# Patient Record
Sex: Male | Born: 1950 | Race: White | Hispanic: No | Marital: Single | Smoking: Former smoker
Health system: Southern US, Community
[De-identification: ages and names within clinical notes are randomized; demographics above are authoritative.]

## PROBLEM LIST (undated history)

## (undated) DIAGNOSIS — I10 Essential (primary) hypertension: Secondary | ICD-10-CM

## (undated) DIAGNOSIS — E785 Hyperlipidemia, unspecified: Secondary | ICD-10-CM

## (undated) DIAGNOSIS — K469 Unspecified abdominal hernia without obstruction or gangrene: Secondary | ICD-10-CM

## (undated) DIAGNOSIS — Z9889 Other specified postprocedural states: Secondary | ICD-10-CM

## (undated) DIAGNOSIS — I509 Heart failure, unspecified: Secondary | ICD-10-CM

## (undated) DIAGNOSIS — Z8547 Personal history of malignant neoplasm of testis: Secondary | ICD-10-CM

## (undated) DIAGNOSIS — I2510 Atherosclerotic heart disease of native coronary artery without angina pectoris: Secondary | ICD-10-CM

## (undated) DIAGNOSIS — IMO0001 Reserved for inherently not codable concepts without codable children: Secondary | ICD-10-CM

## (undated) HISTORY — PX: HERNIA REPAIR: SHX51

---

## 2001-11-21 ENCOUNTER — Inpatient Hospital Stay (HOSPITAL_COMMUNITY): Admission: EM | Admit: 2001-11-21 | Discharge: 2001-11-24 | Payer: Self-pay | Admitting: *Deleted

## 2001-11-24 ENCOUNTER — Encounter: Payer: Self-pay | Admitting: Family Medicine

## 2001-12-18 ENCOUNTER — Encounter: Admission: RE | Admit: 2001-12-18 | Discharge: 2001-12-18 | Payer: Self-pay | Admitting: Family Medicine

## 2004-12-09 ENCOUNTER — Ambulatory Visit (HOSPITAL_COMMUNITY): Admission: RE | Admit: 2004-12-09 | Discharge: 2004-12-09 | Payer: Self-pay | Admitting: Gastroenterology

## 2004-12-09 ENCOUNTER — Encounter (INDEPENDENT_AMBULATORY_CARE_PROVIDER_SITE_OTHER): Payer: Self-pay | Admitting: *Deleted

## 2008-02-24 ENCOUNTER — Ambulatory Visit: Payer: Self-pay | Admitting: Surgery

## 2008-03-18 ENCOUNTER — Ambulatory Visit: Payer: Self-pay | Admitting: Surgery

## 2008-03-18 ENCOUNTER — Encounter: Payer: Self-pay | Admitting: Surgery

## 2008-03-18 ENCOUNTER — Inpatient Hospital Stay (HOSPITAL_COMMUNITY): Admission: RE | Admit: 2008-03-18 | Discharge: 2008-03-19 | Payer: Self-pay | Admitting: Surgery

## 2008-04-06 ENCOUNTER — Ambulatory Visit: Payer: Self-pay | Admitting: Surgery

## 2008-10-05 ENCOUNTER — Ambulatory Visit: Payer: Self-pay | Admitting: Surgery

## 2010-04-05 ENCOUNTER — Telehealth (INDEPENDENT_AMBULATORY_CARE_PROVIDER_SITE_OTHER): Payer: Self-pay | Admitting: *Deleted

## 2010-10-25 NOTE — Progress Notes (Signed)
  Request Recieved from [facility] sent to [facility]  April 05, 2010 8:57 AM

## 2011-02-07 NOTE — Procedures (Signed)
CAROTID DUPLEX EXAM   INDICATION:  Follow up carotid artery disease.   HISTORY:  Diabetes:  No.  Cardiac:  Yes.  Hypertension:  No.  Smoking:  Yes.  Previous Surgery:  Right CEA 03/18/2008 by Dr. Myra Gianotti.  CV History:  No.  Amaurosis Fugax No, Paresthesias No, Hemiparesis No.                                       RIGHT             LEFT  Brachial systolic pressure:         119               120  Brachial Doppler waveforms:         Triphasic         Triphasic  Vertebral direction of flow:        Antegrade         Antegrade  DUPLEX VELOCITIES (cm/sec)  CCA peak systolic                   116               110  ECA peak systolic                   158               518  ICA peak systolic                   128               168  ICA end diastolic                   51                52  PLAQUE MORPHOLOGY:                  N/A               Heterogenous  PLAQUE AMOUNT:                      N/A               Moderate  PLAQUE LOCATION:                    N/A               ICA/ECA   IMPRESSION:  1. Right velocities, mid internal carotid artery, near curvature, are      suggestive of a 40-59% stenosis; however, no significant plaque was      visualized.  2. Left internal carotid artery shows evidence of 40-59% stenosis.  3. Left external carotid artery stenosis.   ___________________________________________  V. Charlena Cross, MD   AS/MEDQ  D:  10/05/2008  T:  10/05/2008  Job:  [id number]

## 2011-02-07 NOTE — Discharge Summary (Signed)
Jordan Johnston, Jordan Johnston                ACCOUNT NO.:  [id number]   MEDICAL RECORD NO.:  [id number]          PATIENT TYPE:  INP   LOCATION:  3305                         FACILITY:  [facility]   PHYSICIAN:  Juleen China IV, MDDATE OF BIRTH:  1951-09-10   DATE OF ADMISSION:  03/18/2008  DATE OF DISCHARGE:  03/19/2008                               DISCHARGE SUMMARY   The patient of Dr. Myra Gianotti.   FINAL DISCHARGE DIAGNOSES:  1. Right carotid occlusive disease.  2. Coronary artery disease.  3. Testicular cancer.  4. Hypertension.  5. Hyperlipidemia.   PROCEDURES PERFORMED:  On March 19, 2008, the right carotid  endarterectomy with bovine pericardial patch angioplasty by Dr. Myra Gianotti.   COMPLICATIONS:  None.   DISCHARGE MEDICATIONS:  1. Lovaza 1 g 4 tablets daily.  2. Niaspan 1000 mg 2 tablets p.o. nightly.  3. Lisinopril 5 mg p.o. b.i.d.  4. Metoprolol 25 mg p.o. b.i.d.  5. Aspirin 325 mg p.o. daily.  6. Zocor 20 mg p.o. nightly.  7. Percocet 5/325 1 p.o. q.4 h. p.r.n.,  total #20 were given.   CONDITION ON DISCHARGE:  Stable, improving.   DISPOSITION:  He is being discharged home in stable and improving  condition.  He is instructed to clean his wound with soap and warm  water.  He is to increase his activity slowly.  He may walk up steps.  He should not drive for 3 weeks.  He is to observe his wound for  drainage, increasing swelling, redness, pain, fever greater than 101.2,  and neurological changes.  He is to see Dr. Myra Gianotti in 2 weeks in the  office and will arrange the appointment.   Brief identifying statement of complete details, please refer the typed  history and physical.  Briefly, this very pleasant 60 year old gentleman  was referred to Dr. Myra Gianotti for asymptomatic right carotid stenosis.  Dr. Myra Gianotti recommended carotid endarterectomy for stroke prevention.  He was informed of the risks and benefits of the procedure, and after  careful consideration, he elected to  proceed with surgery.   HOSPITAL COURSE:  Preoperative workup was completed as an outpatient.  He was brought in through same-day surgery and underwent the  aforementioned right carotid endarterectomy.  For complete details,  please refer the typed operative report.  The procedure was without  complications.  He was returned to the Postanesthesia Care Unit  extubated.  Following stabilization, he was transferred to a bed on a  surgical step-down unit.  He was observed overnight.  He was desirous of  discharge the following morning.  He was neurologically intact.  He was  felt stable and was discharged home.      Wilmon Arms, PA      V. Charlena Cross, MD  Electronically Signed    KEL/MEDQ  D:  03/19/2008  T:  03/19/2008  Job:  [id number]   cc:   Jorge Ny, MD

## 2011-02-07 NOTE — Assessment & Plan Note (Signed)
OFFICE VISIT   Jordan Johnston, Jordan Johnston  DOB:  1950-12-08                                       04/06/2008  ZOXWR#:[id number]   REASON FOR VISIT:  Followup carotid endarterectomy.   HISTORY:  This is a 59 year old gentleman who had an asymptomatic right  carotid stenosis.  He underwent a right carotid endarterectomy on  03/18/2008.  Postop course was uncomplicated.  Comes back today for  followup.  He has been doing well.  He has not been having headaches.  He did have some mild drainage from his incision.  He has not had  fevers.  He is not having neurologic deficits.   EXAM:  His blood pressure is 84/61 on the left and 106/66 in the right.  Pulse is 90.  General:  He is well appearing in no acute distress.  Cranial nerves 2-12 grossly intact.  His incision is well healed.  There  is a fullness over the incision.  He is neurologically intact.   ASSESSMENT/PLAN:  Status post right carotid endarterectomy.   PLAN:  I have given the patient permission mentioned to go back to work  today.  He does have a healing ridge over his incision, which is likely  just scar tissue.  I expect this to go down over time.  He is without  neurologic deficits.  The patient should continue his medication profile  today.  I will see him back in 6 months to see how he is doing.   Jorge Ny, MD  Electronically Signed   VWB/MEDQ  D:  04/06/2008  T:  04/07/2008  Job:  [id number]   cc:   Ritta Slot, MD

## 2011-02-07 NOTE — Assessment & Plan Note (Signed)
OFFICE VISIT   Jordan Johnston, Jordan Johnston  DOB:  September 15, 1951                                       02/24/2008  EAVWU#:[id number]   REASON FOR VISIT:  Right carotid stenosis.   REFERRING PHYSICIAN:  Dr. Silas Sacramento.   HISTORY:  This is a 60 year old white male, who I am seeing at the  request of Dr. Lynnea Ferrier for evaluation of right carotid stenosis.  The  patient is asymptomatic.  Specifically, he denies amaurosis fugax,  numbness, weakness in either extremity, slurred speech.  He does have a  significant coronary history.  He has a known 100% proximal circumflex  occlusion and a 30% proximal LAD lesion, as well as a 90% ostial  diagonal lesion.  He has an ejection fraction of 40%.  The patient also  has dyslipidemia, as well as hypertension.   PAST MEDICAL HISTORY:  Significant for coronary artery disease,  testicular cancer, hypertension and hyperlipidemia.   REVIEW OF SYSTEMS:  GENERAL:  No fever or chills.  No significant weight  loss.  CARDIAC:  No chest pain or shortness of breath.  PULMONARY:  Negative.  GI:  Negative.  GU:  Negative.  VASCULAR:  Negative.  NEURO:  Negative.  ORTHO:  Negative.  HEME:  Negative.  PSYCH:  Negative.  ENT:  Negative.   PAST SURGICAL HISTORY:  Testicular cancer removal and a midline hernia  repair x2.   FAMILY HISTORY:  No coronary artery disease.   SOCIAL HISTORY:  He is single.  He works at [employer] and in  Hol[employer].  He currently smokes approximately a pack a day.  He has a  40 pack year history.   MEDICATIONS:  Lovaza 1 gram per day.  Niaspan 1000 mg.  Lisinopril 10  mg.  Metoprolol 25 mg twice a day.  Bayer aspirin 325 daily.  Simvastatin 20 mg per day.  Lisinopril 5 mg twice a day.   ALLERGIES:  None.   PHYSICAL EXAMINATION:  Blood pressure is 109/65, heart rate is 70.  In  general, he is well-appearing in no acute distress.  HEENT:  Normocephalic, atraumatic.  Pupils are equal.  Sclerae are  anicteric.  Neck is supple.  There is no JVD.  There are bilateral carotid bruits.  Cardiovascular:  Regular rate and rhythm.  No murmurs, rubs or gallops.  Pulmonary:  Respirations and lungs are clear bilaterally.  Abdomen is  soft, nontender.  There is a midline scar.  There is no  hepatosplenomegaly.  Extremities are warm and well-perfused.  Psych, he  is alert and oriented x3.  Neuro:  Cranial nerves 2-12 grossly intact.  Skin is without rash.   DIAGNOSTIC STUDIES:  The patient comes with a duplex ultrasound, which  reveals 70-99% diameter reduction of the right carotid artery.   ASSESSMENT/PLAN:  This is a 60 year old with asymptomatic right carotid  stenosis.   PLAN:  I discussed the risks and benefits of proceeding with a carotid  endarterectomy at this time.  I quoted him a risk of stroke as well as  nerve injury, as well as the risk of bleeding and infection.  At this  point in time, the patient has agreed to proceed with carotid  endarterectomy.  He does need several weeks to organize being able to  take time away from work.  I have scheduled him to undergo  a right  carotid endarterectomy on Wednesday, June 24th.  I am going to re-image  his carotid today in the office to make sure that he has normal carotid  artery above the stenosis.   Jorge Ny, MD  Electronically Signed   VWB/MEDQ  D:  02/24/2008  T:  02/25/2008  Job:  [id number]   cc:   Ritta Slot, MD

## 2011-02-07 NOTE — Procedures (Signed)
CAROTID DUPLEX EXAM   INDICATION:  Followup, right carotid artery disease.   HISTORY:  Diabetes:  No.  Cardiac:  Yes.  Hypertension:  No.  Smoking:  Yes.  Previous Surgery:  CV History:  Amaurosis Fugax No, Paresthesias No, Hemiparesis No                                       RIGHT             LEFT  Brachial systolic pressure:  Brachial Doppler waveforms:  Vertebral direction of flow:        Antegrade  DUPLEX VELOCITIES (cm/sec)  CCA peak systolic                   87  ECA peak systolic                   117  ICA peak systolic                   326  ICA end diastolic                   123  PLAQUE MORPHOLOGY:                  Heterogenous  PLAQUE AMOUNT:                      Right  PLAQUE LOCATION:                    ICA, ECA   IMPRESSION:  1. Limited study.  2. 80-99% stenosis noted in the right internal carotid artery.  3. Antegrade right vertebral arteries.   ___________________________________________  V. Charlena Cross, MD   MG/MEDQ  D:  02/24/2008  T:  02/24/2008  Job:  [id number]

## 2011-02-07 NOTE — Op Note (Signed)
NAMEROBERTA, KELLY                ACCOUNT NO.:  [id number]   MEDICAL RECORD NO.:  [id number]          PATIENT TYPE:  INP   LOCATION:  3305                         FACILITY:  [facility]   PHYSICIAN:  Juleen China IV, MDDATE OF BIRTH:  10/05/1950   DATE OF PROCEDURE:  03/18/2008  DATE OF DISCHARGE:                               OPERATIVE REPORT   PREOPERATIVE DIAGNOSIS:  Asymptomatic right carotid stenosis.   POSTOPERATIVE DIAGNOSIS:  Asymptomatic right carotid stenosis.   PROCEDURE PERFORMED:  Right carotid endarterectomy with bovine  pericardium patch using a10-French shunt.   SURGEON:  Jorge Ny, MD   ASSISTANT:  Wilmon Arms, PA   ANESTHESIA:  General.   FINDINGS:  80% stenosis.  No thrombus.   SPECIMENS:  Carotid plaque to pathology.   COMPLICATIONS:  None.   INDICATIONS:  This is a 60 year old gentleman who was found by screening  ultrasound to have 80%-99% stenosis within his right carotid artery.  He  has been asymptomatic.  He has been on an aspirin.  He comes in today  for an elective carotid endarterectomy.  The risks and benefits of the  procedure were discussed with the patient.  Informed consent was signed.   PROCEDURE:  The patient was identified in the holding area and taken to  the room #6.  He was placed supine on table.  General endotracheal  anesthesia was administered.  The patient was prepped and draped in  standard sterile fashion.  A time-out was called and preoperative  antibiotics were administered.  An oblique incision was made on the  anterior part of the sternocleidomastoid.  Cautery was used to dissect  through the subcutaneous tissue.  The platysmal muscle was divided with  Bovie cautery.  The anterior surface of the internal jugular vein was  skeletonized and the common facial vein was identified and divided  between 2-0 silk ties.  The internal jugular vein was then retracted  laterally, exposing the carotid sheath.  The carotid  sheath was opened  sharply, the carotid artery was isolated first.  It was fully mobilized  and then encircled with the vessel loop.  The vagus nerve seen and  preserved.  Dissection began cephalad.  The external carotid artery was  identified first.  There was a diminutive inferior thyroid artery which  was divided between 2-0 silk ties.  The external carotid artery was then  dissected free and then encircled with a blue vessel loop.  Attention  was then turned towards the internal carotid artery.  It was fully  mobilized proximally and then I elected to identify the hypoglossal  nerve to ensure that it was preserved.  Once the hypoglossal nerve was  identified, there was reflected cephalad out of the operative field and  the remaining portion of the distal internal carotid artery was  dissected free.  Once I had gotten above the plaque, an umbilical tape  was placed.  Next, the patient was given systemic heparinization.  After  2 minutes of circulation, the internal carotid artery was clamped with a  baby Gregory clamp.  Next,  the external carotid was occluded with a baby  Earl Lites and the common carotid artery was then occluded with a  peripheral DeBakey clamp.  A #11-blade was used to make an arteriotomy,  which was extended on the anterolateral aspect of the common carotid  artery up on to the internal carotid artery.  Next, a 10-French whistle-  tip catheter was placed as a shunt from the internal carotid artery to  the common carotid artery.  Total occlusion time to get the shunt in  place was approximately 2.5 minutes.  Once the shunt was opened up, the  arteriotomy was then extended with Potts scissors.  Endarterectomy was  then performed with a Runner, broadcasting/film/video.  A good distal endpoint was  obtained.  A 2 tacking sutures with 7-0 Prolene were placed.  The  endarterectomy as planned was then irrigated to make sure that there was  no potential embolic debris and if there was it was  removed.  Eversion  endarterectomy had been performed on the external carotid artery.  Once  I was satisfied with the endarterectomy plan, I set up for patch the  angioplasty.  A bovine pericardium patch was selected.  This was sewn in  the place using a running 6-0 Prolene suture.  Prior to completion of  the anastomosis, the shunt was removed.  The internal, external, and  common carotid arteries were appropriately flushed.  The artery was then  filled with heparinized saline.  The anastomosis was completed.  Again  prior to completion, the internal, external, and common carotid arteries  were again flushed for the second time.  The anastomosis was then  secured.  The clamp was first released on the external carotid artery  followed by the common carotid artery and after 15 cardiac cycles the  internal clamp was released.  At this time, 50 mg of Protamine were  given.  I evaluated the common external, internal carotid artery with  Doppler.  They had good signals.  Once I was satisfied with hemostasis,  I placed a 16-French Blake drain.  The carotid sheath was then  reapproximate with interrupted 3-0 Vicryl.  The platysmal muscle was  then reapproximated with running a 3-0 Vicryl and the skin was closed  with a 4-0 Vicryl.  The drain was secured in the place with a 3-0 nylon.  Dermabond was placed on the skin.  The patient was then successfully  awakened from anesthesia.  He was found to be moving all in 4  extremities to command.  There were no complications.           ______________________________  V. Charlena Cross, MD  Electronically Signed     VWB/MEDQ  D:  03/18/2008  T:  03/19/2008  Job:  [id number]

## 2011-02-10 NOTE — Discharge Summary (Signed)
. [facility]  Patient:    Jordan Johnston, Jordan Johnston Visit Number: [id number] MRN: [id number]          Service Type: MED Location: [id number] Attending Physician:  McDiarmid, Leighton Roach. Dictated by:   Michell Heinrich, M.D. Admit Date:  11/21/2001 Discharge Date: 11/24/2001   CC:         [facility], [address], [place], [place]   Discharge Summary  ADMISSION DIAGNOSES: 1. Pneumonia. 2. Weight loss. 3. Oral Candidiasis. 4. Anemia. 5. Abnormal liver function tests.  DISCHARGE DIAGNOSES: 1. Pneumonia. 2. Weight loss. 3. Oral Candidiasis. 4. Anemia. 5. Abnormal liver function tests.  DISCHARGE MEDICATIONS: 1. Azithromycin 500 mg daily for the next seven days. 2. Nystatin swish and swallow every six hours taken until three days after    all oral thrush has resolved.  SERVICE:  Conservation officer, historic buildings.  ATTENDING:  Huey Bienenstock McDiarmid, M.D.  RESIDENT: 1. Bradly Bienenstock, M.D. 2. Michell Heinrich, M.D.  CONSULTS:  None.  PROCEDURES:  None.  HISTORY AND PHYSICAL:  For complete H&P, see resident H&P in the chart. Briefly, this is a 60 year old white male referred from urgent medical center for concern of persistent fever, cough, lightheadedness, and history of weight loss.  Initial concerns were for either HIV infection or some malignancy.  PAST MEDICAL HISTORY:  This was significant for a testicular cancer greater than 20 years ago treated by surgery, chemotherapy, and radiation.  He had a significant history of alcohol ingestion on a regular basis but quit over 20 years ago.  He does have a long history of smoking.  PHYSICAL EXAMINATION:  HEENT:  There were white plaques on the buckle mucosa which would not rub off and poor dentition.  PULMONARY:  There were basilar crackles, left greater than right, O2 saturation normal.  White blood cell count on admission was 16,000, hemoglobin 11.2.  A complete metabolic panel  revealed a normal AST and ALT with an albumin of 2.3, alkaline phosphatase of 182, and a total bilirubin of 1.3.  Chest x-ray showed left lower lung pneumonia.  Also initial evaluation from urgent medical showed concern for possible dysarthria so there was thought that the patient may be having a CVA.  Head CT scan on initial evaluation showed no acute findings.  He had no notable dysarthria on initial evaluation here.  This patient was diagnosed with pneumonia and admitted for IV antibiotics.  HOSPITAL COURSE:  #1 - PNEUMONIA:  The patient was started on Rocephin and Azithromycin and also given albuterol and Atrovent nebulizers.  Blood cultures were obtained and were negative at discharge.  HIV status was checked and was nonreactive.  The patient was slightly hypoxic with ambulation (room air sat after ambulation 79%) and this was brought into normal range with three liters of O2 per nasal cannula.  Over the course of his three days in the hospital, he had improvement in his breathing and decrease in his cough.  He also had eventually no requirement for oxygen.  He was much improved and afebrile on November 23, 2001, and was switched to oral antibiotics only.  He continued to improve from pulmonary standpoint and was discharged home on the previously mentioned discharge medications.  #2 - ORAL CANDIDIASIS:  There was concern of immunodeficiency with this diagnosis but HIV was nonreactive.  This showed gradual improvement with Nystatin swish and swallow and he was discharged home on this medication.  #3 - ABNORMAL LIVER ENZYMES:  Alkaline phosphatase  was persistently elevated during this hospitalization and albumin was low as was total protein.  Also, prothrombin time was 1.6 and PTT was 42 and the patient was not on any anticoagulation.  AST and ALT were within normal limits except for on the last day of hospitalization, his ALT was 45.  On the day of discharge, his hepatic profile  was: total bilirubin 1.4, direct bilirubin 0.6, indirect bilirubin 0.8, alkaline phosphatase 216, AST 35, ALT 45, total protein 5.6, albumin 1.7, GGT was 51.  The differential diagnosis was chronic liver disease secondary to hepatitis virus B or C, or secondary to long-term alcohol abuse.  Also, would include other less notable viral illnesses such as EBV or CMV.  Hepatitis panel was pending at the time of discharge.  Abdominal ultrasound showed a contracted gallbladder without gallstones and liver of normal echogenicity and size. Pancreas was also normal.  No ascites was seen.  A urinalysis was obtained to look for other causes of low protein and this showed 30 protein. For follow-up, this patient may benefit from repeat of hepatic profile, 24-hour urine for protein, and clinical follow-up.  #4 - ANEMIA:  The patient had no documented Hemoccult testing and iron studies were the following:  ferritin 1525, iron 18, total iron binding capacity 125, percent saturation 14; hemoglobin 11.4, MCV 89.5. These studies indicate anemia of chronic disease, but it is unclear what his chronic disease was. This anemia should be followed up in four to six weeks with a hemoglobin checked.  DISPOSITION:  The patient was discharged home in improved condition on previously mentioned discharge medications.  OTHER NOTABLE LAB DATA: Influenza A and B antigen negative.  PSA 0.15.  FOLLOW-UP: This was to be in one to two weeks at the urgent medical center on [address] which is where the patient received his primary care. Dictated by:   Michell Heinrich, M.D. Attending Physician:  McDiarmid, Tawanna Cooler D. DD:  11/24/01 TD:  11/25/01 Job: [id number]

## 2011-02-10 NOTE — Op Note (Signed)
Jordan Johnston, Jordan Johnston                ACCOUNT NO.:  [id number]   MEDICAL RECORD NO.:  [id number]          PATIENT TYPE:  AMB   LOCATION:  ENDO                         FACILITY:  [facility]   PHYSICIAN:  James L. Malon Kindle., M.D.DATE OF BIRTH:  [birth year]   DATE OF PROCEDURE:  12/09/2004  DATE OF DISCHARGE:                                 OPERATIVE REPORT   PROCEDURE:  Colonoscopy with polypectomy.   MEDICATIONS:  Fentanyl 50 mcg, Versed 3 mg IV.   SCOPE:  Olympus pediatric adjustable colonoscope.   INDICATIONS:  Colon cancer screening.   DESCRIPTION OF PROCEDURE:  The procedure has been explained to the patient  and consent obtained.  In the left lateral decubitus position, the Olympus  scope was inserted.  The prep was excellent.  I was able to reach the cecum  without difficulty.  On the way in, a 1.5 cm sessile polyp was encountered  in the descending colon.  This was removed with a snare.  This was  recovered.  The cecum was arrived at and identified by identification of the  ileocecal valve and appendiceal orifice.  The scope withdrawn in the cecum.  The ascending colon, transverse colon, splenic flexure, descending and  sigmoid colon were seen well.  Other than the one polyp in the descending  colon, no other polyps were seen.  There was no diverticular disease.  The  scope was withdrawn out of the rectum.  The rectum was free of polyps.  The  patient tolerated the procedure well.   ASSESSMENT:  Descending colon polyp, removed.  211.3.   PLAN:  Routine post polypectomy instructions.  Will recommend repeating in  five years.      JLE/MEDQ  D:  12/09/2004  T:  12/09/2004  Job:  [id number]   cc:   Nilda Simmer, M.D.  [address]  [place] [address]  Fax: [phone]

## 2011-06-22 LAB — COMPREHENSIVE METABOLIC PANEL
ALT: 36
AST: 38 — ABNORMAL HIGH
Albumin: 3.5
Alkaline Phosphatase: 84
BUN: 14
CO2: 27
Calcium: 9.1
Chloride: 103
Creatinine, Ser: 0.8
GFR calc Af Amer: >60
GFR calc non Af Amer: >60
Glucose, Bld: 102 — ABNORMAL HIGH
Potassium: 4.1
Sodium: 138
Total Bilirubin: 0.6
Total Protein: 6.9

## 2011-06-22 LAB — BASIC METABOLIC PANEL
Chloride: 106
Creatinine, Ser: 1.24
GFR calc Af Amer: >60
GFR calc non Af Amer: >60
Potassium: 3.7

## 2011-06-22 LAB — CBC
HCT: 28.9 — ABNORMAL LOW
HCT: 35.2 — ABNORMAL LOW
Hemoglobin: 12.2 — ABNORMAL LOW
MCHC: 34.6
MCV: 80.4
MCV: 93
Platelets: 235
RBC: 3.59 — ABNORMAL LOW
RBC: 3.79 — ABNORMAL LOW
RDW: 14.8
WBC: 7.6
WBC: 9.2

## 2011-06-22 LAB — URINALYSIS, ROUTINE W REFLEX MICROSCOPIC
Glucose, UA: NEGATIVE
Hgb urine dipstick: NEGATIVE
Specific Gravity, Urine: 1.024
pH: 6

## 2011-06-22 LAB — PROTIME-INR: Prothrombin Time: 15.3 — ABNORMAL HIGH

## 2011-06-22 LAB — ABO/RH: ABO/RH(D): O POS

## 2012-03-13 ENCOUNTER — Ambulatory Visit (INDEPENDENT_AMBULATORY_CARE_PROVIDER_SITE_OTHER): Payer: 59 | Admitting: Emergency Medicine

## 2012-03-13 ENCOUNTER — Ambulatory Visit: Payer: 59

## 2012-03-13 VITALS — BP 107/61 | HR 96 | Temp 97.9°F | Resp 16 | Ht 68.0 in | Wt 162.0 lb

## 2012-03-13 DIAGNOSIS — J029 Acute pharyngitis, unspecified: Secondary | ICD-10-CM

## 2012-03-13 DIAGNOSIS — R918 Other nonspecific abnormal finding of lung field: Secondary | ICD-10-CM

## 2012-03-13 DIAGNOSIS — F17200 Nicotine dependence, unspecified, uncomplicated: Secondary | ICD-10-CM

## 2012-03-13 DIAGNOSIS — R9389 Abnormal findings on diagnostic imaging of other specified body structures: Secondary | ICD-10-CM

## 2012-03-13 LAB — POCT RAPID STREP A (OFFICE): Rapid Strep A Screen: NEGATIVE

## 2012-03-13 MED ORDER — FIRST-DUKES MOUTHWASH MT SUSP: OROMUCOSAL | Status: DC

## 2012-03-13 MED ORDER — AMOXICILLIN 875 MG PO TABS: 875.0000 mg | ORAL_TABLET | Freq: Two times a day (BID) | ORAL | Status: AC

## 2012-03-13 NOTE — Progress Notes (Signed)
Subjective:    Patient ID: Jordan Johnston, male    DOB: 24-Apr-1951, 61 y.o.   MRN: [id number]  Sore Throat   Fever    patient enters with a ten-day history of sore throat . He is a pack-a-day smoker. He denies having a productive cough or coughing up blood. His ears and nose have been normal he has not been coughing up any phlegm.    Review of Systems  Constitutional: Positive for fever.   he denies any other symptoms. Past history is pertinent in that he does have coronary disease and sees Dr. Gery Pray. He also has a history of testicular cancer and received chemotherapy for that.     Objective:   Physical Exam  Constitutional: He appears well-developed and well-nourished.  HENT:  Right Ear: External ear normal.  Left Ear: External ear normal.       There is redness to the back of the throat.  Eyes: Pupils are equal, round, and reactive to light.  Neck: No tracheal deviation present. No thyromegaly present.  Cardiovascular: Normal rate and regular rhythm.        There is a 2/6 systolic murmur at the base of the heart  Pulmonary/Chest: Effort normal and breath sounds normal. No respiratory distress.   Results for orders placed during the hospital encounter of 03/18/08  CBC      Component Value Range   WBC 7.6     RBC 3.79 (*)    Hemoglobin 12.2 (*)    HCT 35.2 (*)    MCV 93.0     MCHC 34.6     RDW 14.8     Platelets 235    COMPREHENSIVE METABOLIC PANEL      Component Value Range   Sodium 138     Potassium 4.1     Chloride 103     CO2 27     Glucose, Bld 102 (*)    BUN 14     Creatinine, Ser 0.80     Calcium 9.1     Total Protein 6.9     Albumin 3.5     AST 38 (*)    ALT 36     Alkaline Phosphatase 84     Total Bilirubin 0.6     GFR calc non Af Amer >60     GFR calc Af Amer       Value: >60            The eGFR has been calculated     using the MDRD equation.     This calculation has not been     validated in all clinical  PROTIME-INR      Component Value  Range   Prothrombin Time 15.3 (*)    INR 1.2    APTT      Component Value Range   aPTT 36    TYPE AND SCREEN      Component Value Range   ABO/RH(D) O POS     Antibody Screen NEG     Sample Expiration 03/20/2008    URINALYSIS, ROUTINE W REFLEX MICROSCOPIC      Component Value Range   Color, Urine YELLOW     APPearance CLEAR     Specific Gravity, Urine 1.024     pH 6.0     Glucose, UA NEGATIVE     Hgb urine dipstick NEGATIVE     Bilirubin Urine NEGATIVE     Ketones, ur NEGATIVE     Protein, ur  NEGATIVE     Urobilinogen, UA 0.2     Nitrite NEGATIVE     Leukocytes, UA       Value: NEGATIVE MICROSCOPIC NOT DONE ON URINES WITH NEGATIVE PROTEIN, BLOOD, LEUKOCYTES, NITRITE, OR GLUCOSE <1000 mg/dL.  ABO/RH      Component Value Range   ABO/RH(D) O POS    BASIC METABOLIC PANEL      Component Value Range   Sodium 137     Potassium 3.7     Chloride 106     CO2 26     Glucose, Bld 147 (*)    BUN 13     Creatinine, Ser 1.24     Calcium 8.1 (*)    GFR calc non Af Amer >60     GFR calc Af Amer       Value: >60            The eGFR has been calculated     using the MDRD equation.     This calculation has not been     validated in all clinical  CBC      Component Value Range   WBC 9.2     RBC 3.59 (*)    Hemoglobin 9.5 (*)    HCT 28.9 (*)    MCV 80.4     MCHC 33.0     RDW 14.2     Platelets 168     Results for orders placed in visit on 03/13/12  POCT RAPID STREP A (OFFICE)      Component Value Range   Rapid Strep A Screen Negative  Negative   [facility] reading (PRIMARY) by  Dr.Alida Greiner there appears to be either some cavitary areas or blebs in the right upper lobe with thickening of the apices bilaterally. .      Assessment & Plan:  Chest x-ray is definitely abnormal. He may have areas of emphysema involving the upper lobes but need to proceed with a CT of his chest to be sure we're not dealing with a more serious problem.

## 2012-03-29 ENCOUNTER — Ambulatory Visit: Admission: RE | Admit: 2012-03-29 | Payer: Self-pay | Source: Ambulatory Visit

## 2012-05-05 ENCOUNTER — Encounter (HOSPITAL_COMMUNITY): Payer: Self-pay | Admitting: *Deleted

## 2012-05-05 ENCOUNTER — Emergency Department (HOSPITAL_COMMUNITY): Payer: 59

## 2012-05-05 ENCOUNTER — Observation Stay (HOSPITAL_COMMUNITY): Payer: 59

## 2012-05-05 ENCOUNTER — Observation Stay (HOSPITAL_COMMUNITY)
Admission: EM | Admit: 2012-05-05 | Discharge: 2012-05-06 | Disposition: A | Discharge status: Home / Self Care | Payer: 59 | Attending: Internal Medicine | Admitting: Internal Medicine

## 2012-05-05 DIAGNOSIS — R918 Other nonspecific abnormal finding of lung field: Secondary | ICD-10-CM

## 2012-05-05 DIAGNOSIS — R011 Cardiac murmur, unspecified: Secondary | ICD-10-CM | POA: Diagnosis present

## 2012-05-05 DIAGNOSIS — Z8547 Personal history of malignant neoplasm of testis: Secondary | ICD-10-CM | POA: Insufficient documentation

## 2012-05-05 DIAGNOSIS — I2510 Atherosclerotic heart disease of native coronary artery without angina pectoris: Secondary | ICD-10-CM | POA: Insufficient documentation

## 2012-05-05 DIAGNOSIS — E860 Dehydration: Principal | ICD-10-CM | POA: Insufficient documentation

## 2012-05-05 DIAGNOSIS — I779 Disorder of arteries and arterioles, unspecified: Secondary | ICD-10-CM | POA: Diagnosis present

## 2012-05-05 DIAGNOSIS — R55 Syncope and collapse: Secondary | ICD-10-CM | POA: Diagnosis present

## 2012-05-05 DIAGNOSIS — R911 Solitary pulmonary nodule: Secondary | ICD-10-CM | POA: Diagnosis present

## 2012-05-05 HISTORY — DX: Unspecified abdominal hernia without obstruction or gangrene: K46.9

## 2012-05-05 HISTORY — DX: Other specified postprocedural states: Z98.890

## 2012-05-05 HISTORY — DX: Personal history of malignant neoplasm of testis: Z85.47

## 2012-05-05 LAB — CBC WITH DIFFERENTIAL/PLATELET
Basophils Absolute: 0 10*3/uL (ref 0.0–0.1)
Eosinophils Relative: 2 % (ref 0–5)
Lymphocytes Relative: 22 % (ref 12–46)
Lymphs Abs: 2.3 10*3/uL (ref 0.7–4.0)
MCV: 90.7 fL (ref 78.0–100.0)
Neutro Abs: 7.3 10*3/uL (ref 1.7–7.7)
Platelets: 198 10*3/uL (ref 150–400)
RBC: 4.97 MIL/uL (ref 4.22–5.81)
WBC: 10.3 10*3/uL (ref 4.0–10.5)

## 2012-05-05 LAB — COMPREHENSIVE METABOLIC PANEL
ALT: 15 U/L (ref 0–53)
AST: 23 U/L (ref 0–37)
Alkaline Phosphatase: 91 U/L (ref 39–117)
CO2: 23 mEq/L (ref 19–32)
Calcium: 9.2 mg/dL (ref 8.4–10.5)
Chloride: 102 mEq/L (ref 96–112)
GFR calc Af Amer: >90 mL/min (ref >90)
GFR calc non Af Amer: >90 mL/min (ref >90)
Glucose, Bld: 79 mg/dL (ref 70–99)
Potassium: 4.4 mEq/L (ref 3.5–5.1)
Sodium: 136 mEq/L (ref 135–145)
Total Bilirubin: 0.3 mg/dL (ref 0.3–1.2)

## 2012-05-05 LAB — CARDIAC PANEL(CRET KIN+CKTOT+MB+TROPI)
Relative Index: INVALID (ref 0.0–2.5)
Troponin I: <0.3 ng/mL (ref <0.30)

## 2012-05-05 LAB — GLUCOSE, CAPILLARY

## 2012-05-05 LAB — PHOSPHORUS: Phosphorus: 3.5 mg/dL (ref 2.3–4.6)

## 2012-05-05 MED ORDER — ASPIRIN EC 81 MG PO TBEC
81.0000 mg | DELAYED_RELEASE_TABLET | Freq: Every day | ORAL | Status: DC
  Administered 2012-05-06: 81 mg via ORAL
  Filled 2012-05-05: qty 1

## 2012-05-05 MED ORDER — ASPIRIN EC 81 MG PO TBEC
81.0000 mg | DELAYED_RELEASE_TABLET | Freq: Once | ORAL | Status: AC
  Administered 2012-05-05: 81 mg via ORAL
  Filled 2012-05-05: qty 1

## 2012-05-05 MED ORDER — HYDROCODONE-ACETAMINOPHEN 5-325 MG PO TABS
1.0000 | ORAL_TABLET | ORAL | Status: DC | PRN
  Administered 2012-05-05: 2 via ORAL
  Filled 2012-05-05: qty 2

## 2012-05-05 MED ORDER — IOHEXOL 350 MG/ML SOLN
80.0000 mL | Freq: Once | INTRAVENOUS | Status: AC | PRN
  Administered 2012-05-05: 80 mL via INTRAVENOUS

## 2012-05-05 MED ORDER — ACETAMINOPHEN 650 MG RE SUPP: 650.0000 mg | Freq: Four times a day (QID) | RECTAL | Status: DC | PRN

## 2012-05-05 MED ORDER — SODIUM CHLORIDE 0.9 % IV SOLN
INTRAVENOUS | Status: AC
  Administered 2012-05-05: 100 mL/h via INTRAVENOUS

## 2012-05-05 MED ORDER — SODIUM CHLORIDE 0.9 % IV SOLN
INTRAVENOUS | Status: DC
  Administered 2012-05-05: 125 mL/h via INTRAVENOUS

## 2012-05-05 MED ORDER — ENOXAPARIN SODIUM 40 MG/0.4ML ~~LOC~~ SOLN
40.0000 mg | SUBCUTANEOUS | Status: DC
  Administered 2012-05-06: 40 mg via SUBCUTANEOUS
  Filled 2012-05-05: qty 0.4

## 2012-05-05 MED ORDER — ONDANSETRON HCL 4 MG PO TABS: 4.0000 mg | ORAL_TABLET | Freq: Four times a day (QID) | ORAL | Status: DC | PRN

## 2012-05-05 MED ORDER — ASPIRIN 81 MG PO TABS: 81.0000 mg | ORAL_TABLET | Freq: Every day | ORAL | Status: DC

## 2012-05-05 MED ORDER — ACETAMINOPHEN 325 MG PO TABS: 650.0000 mg | ORAL_TABLET | Freq: Four times a day (QID) | ORAL | Status: DC | PRN

## 2012-05-05 MED ORDER — SODIUM CHLORIDE 0.9 % IV BOLUS (SEPSIS)
1000.0000 mL | Freq: Once | INTRAVENOUS | Status: AC
  Administered 2012-05-05: 1000 mL via INTRAVENOUS

## 2012-05-05 MED ORDER — ONDANSETRON HCL 4 MG/2ML IJ SOLN: 4.0000 mg | Freq: Four times a day (QID) | INTRAMUSCULAR | Status: DC | PRN

## 2012-05-05 MED ORDER — SODIUM CHLORIDE 0.9 % IJ SOLN: 3.0000 mL | Freq: Two times a day (BID) | INTRAMUSCULAR | Status: DC

## 2012-05-05 NOTE — ED Notes (Signed)
Bed:WA18<BR> Expected date:<BR> Expected time:<BR> Means of arrival:<BR> Comments:<BR> EMS

## 2012-05-05 NOTE — ED Provider Notes (Signed)
History     CSN: [id number]  Arrival date & time 05/05/12  1526   First MD Initiated Contact with Patient 05/05/12 1555      Chief Complaint  Patient presents with  . Dehydration    (Consider location/radiation/quality/duration/timing/severity/associated sxs/prior treatment) HPI Comments: Jordan Johnston is a 61 y.o. Male hx testicular cancer in remission, HTN, HL here with syncope. He was working outside in hot weather today. Around 3 pm, he felt lightheadedness and felt that his eyes were heavy. He then sat down and passed out. He woke up in the ambulance. Denies CP or SOB. He is still an active smoker and is smoking 1 ppd. No family hx of CAD.    The history is provided by the patient.    Past Medical History  Diagnosis Date  . Cancer   . Hernia     History reviewed. No pertinent past surgical history.  No family history on file.  History  Substance Use Topics  . Smoking status: Current Everyday Smoker  . Smokeless tobacco: Not on file  . Alcohol Use: No      Review of Systems  Neurological: Positive for syncope.  All other systems reviewed and are negative.    Allergies  Review of patient's allergies indicates no known allergies.  Home Medications   Current Outpatient Rx  Name Route Sig Dispense Refill  . ASPIRIN 81 MG PO TABS Oral Take 81 mg by mouth daily.      BP 132/74  Pulse 89  Temp 97.6 F (36.4 C) (Oral)  Resp 12  SpO2 94%  Physical Exam  Nursing note and vitals reviewed. Constitutional: He is oriented to person, place, and time. He appears well-developed.       NAD   HENT:  Head: Normocephalic and atraumatic.  Mouth/Throat: Oropharynx is clear and moist.  Eyes: Conjunctivae and EOM are normal. Pupils are equal, round, and reactive to light.  Neck: Normal range of motion.  Cardiovascular: Normal rate.        2/6 systolic murmur loudest at R sternal border.   Pulmonary/Chest: Effort normal.       Mild dry crackles diffusely    Abdominal: Soft. Bowel sounds are normal.  Musculoskeletal: Normal range of motion.       No edema  Neurological: He is alert and oriented to person, place, and time.  Skin: Skin is warm.  Psychiatric: He has a normal mood and affect. His behavior is normal. Judgment and thought content normal.    ED Course  Procedures (including critical care time)  Labs Reviewed  PRO B NATRIURETIC PEPTIDE - Abnormal; Notable for the following:    Pro B Natriuretic peptide (BNP) 718.9 (*)     All other components within normal limits  GLUCOSE, CAPILLARY  CBC WITH DIFFERENTIAL  COMPREHENSIVE METABOLIC PANEL  TROPONIN I   Dg Chest 2 View  05/05/2012  *RADIOLOGY REPORT*  Clinical Data: Smoker presenting with dehydration.  Remote history of testicular cancer.  CHEST - 2 VIEW  Comparison: Two-view chest x-ray 03/13/2008.  Findings: Cardiomediastinal silhouette unremarkable, unchanged. Biapical pleuroparenchymal scarring, and parenchymal scarring in the right upper lobe, unchanged.  No new pulmonary parenchymal abnormalities.  No pleural effusions.  Degenerative changes involving the thoracic spine.  Allowing for differences in technique, no significant interval change.  IMPRESSION: No acute cardiopulmonary disease.  Stable biapical pleuroparenchymal scarring and parenchymal scarring in the right upper lobe.  Original Report Authenticated By: Arnell Sieving, M.D.  No diagnosis found.  Date: 05/05/2012  Rate: 87  Rhythm: normal sinus rhythm  QRS Axis: normal  Intervals: normal  ST/T Wave abnormalities: normal  Conduction Disutrbances:IVCD, likely LBBB  Narrative Interpretation:   Old EKG Reviewed: unchanged     MDM  Jordan Johnston is a 61 y.o. male hx HTN, HL here with syncope and dehydration. He also has 2/6 systolic murmur without previous echo in the system. Will check electrolytes, EKG, give IVF. Will likely need admission for further cardiac workup.  8:21 PM Patient feels well. Not  orthostatic. EKG showed LBBB, unchanged. Labs nl, CXR nl. Discussed with Dr. Arthor Captain, [facility] hospital[facility], who accepted the patient to observation.        Richardean Canal, MD 05/05/12 2022

## 2012-05-05 NOTE — ED Notes (Signed)
MD at bedside. 

## 2012-05-05 NOTE — ED Notes (Addendum)
Pt welding in open garage welding, started to feel sleepy, sat down, co worker called EMS. CBG 104

## 2012-05-05 NOTE — H&P (Signed)
[facility] History and Physical  Jordan Johnston VHQ:[id number] DOB: [birth year] DOA: 05/05/2012  Referring physician: Chaney Malling PCP: Melene Plan to [facility] urgent care, when he is sick  Chief Complaint: Presyncope  HPI:  Jordan Johnston is a 61 year old Caucasian male with past medical history of testicular cancer, history of carotid disease with right-sided CEA in 2009. Patient brought to the hospital after he almost collapsed while he was working. Patient said he was working outdoors, he works as a Psychologist, occupational, he fell that he is going to faint and lightheaded. His coworker help him to lay down on the ground, he did not lose his consciousness. He said he drank a couple bottles of soft drinks, did not eat since dinner yesterday, and he was not drinking water to hydrate himself. He denies any chest pain, shortness of breath, palpitations or any focal neurological weakness. He does have history of right-sided carotid disease which treated with endarterectomy in 2009, he also has +2 systolic murmur best heard on the aortic area. He denies any previous history of similar episodes. Upon initial evaluation in the emergency department his x-ray showed some scarring on the apices of the lungs which is chronic, no other biochemical abnormalities. Patient will be placed on observation for his presyncopal episode.  Review of Systems:  Constitutional: negative for anorexia, fevers and sweats Eyes: negative for irritation, redness and visual disturbance Ears, nose, mouth, throat, and face: negative for earaches, epistaxis, nasal congestion and sore throat Respiratory: negative for cough, dyspnea on exertion, sputum and wheezing Cardiovascular: Positive for presyncope, negative for chest pain, dyspnea, lower extremity edema, orthopnea, palpitations Gastrointestinal: negative for abdominal pain, constipation, diarrhea, melena, nausea and vomiting Genitourinary:negative for dysuria, frequency and  hematuria Hematologic/lymphatic: negative for bleeding, easy bruising and lymphadenopathy Musculoskeletal:negative for arthralgias, muscle weakness and stiff joints Neurological: negative for coordination problems, gait problems, headaches and weakness Endocrine: negative for diabetic symptoms including polydipsia, polyuria and weight loss Allergic/Immunologic: negative for anaphylaxis, hay fever and urticaria  Past Medical History  Diagnosis Date  . History of testicular cancer     Remote, 30 yrs +  . Hernia   . History of CEA (carotid endarterectomy)     Right-sided in 2009   History reviewed. No pertinent past surgical history. Social History:  reports that he has been smoking Cigarettes.  He has a 40 pack-year smoking history. He does not have any smokeless tobacco history on file. He reports that he does not drink alcohol. His drug history not on file. Ambulatory, lives at home  No Known Allergies  Family History  Problem Relation Age of Onset  . Diabetes Mother   . Alzheimer's disease Mother   . Coronary artery disease Father   . Melanoma Father     Prior to Admission medications   Medication Sig Start Date End Date Taking? Authorizing Provider  aspirin 81 MG tablet Take 81 mg by mouth daily.   Yes Historical Provider, MD   Physical Exam: Filed Vitals:   05/05/12 1620 05/05/12 1621 05/05/12 1622 05/05/12 1951  BP: 116/64 117/53 110/59 132/74  Pulse: 83 77 87 89  Temp:    97.6 F (36.4 C)  TempSrc:    Oral  Resp:    12  SpO2:    94%   General appearance: alert, cooperative and no distress  Head: Normocephalic, without obvious abnormality, atraumatic  Eyes: conjunctivae/corneas clear. PERRL, EOM's intact. Fundi benign.  Nose: Nares normal. Septum midline. Mucosa normal. No drainage or sinus tenderness.  Throat:  lips, mucosa, and tongue normal; teeth and gums normal  Neck: Supple, no masses, no cervical lymphadenopathy, no JVD appreciated, no meningeal signs,  right-sided previous CEA surgical scar Resp: clear to auscultation bilaterally  Chest wall: no tenderness  Cardio: regular rate and rhythm, S1, S2 normal, there is +2 systolic murmur best heard on the aortic area  GI: soft, non-tender; bowel sounds normal; no masses, no organomegaly  Extremities: extremities normal, atraumatic, no cyanosis or edema  Skin: Skin color, texture, turgor normal. No rashes or lesions  Neurologic: Alert and oriented X 3, normal strength and tone. Normal symmetric reflexes. Normal coordination and gait  Labs on Admission:  Basic Metabolic Panel:  Lab 05/05/12 1610  NA 136  K 4.4  CL 102  CO2 23  GLUCOSE 79  BUN 10  CREATININE 0.87  CALCIUM 9.2  MG --  PHOS --   Liver Function Tests:  Lab 05/05/12 1754  AST 23  ALT 15  ALKPHOS 91  BILITOT 0.3  PROT 7.9  ALBUMIN 3.7   No results found for this basename: LIPASE:5,AMYLASE:5 in the last 168 hours No results found for this basename: AMMONIA:5 in the last 168 hours CBC:  Lab 05/05/12 1754  WBC 10.3  NEUTROABS 7.3  HGB 15.1  HCT 45.1  MCV 90.7  PLT 198   Cardiac Enzymes:  Lab 05/05/12 1754  CKTOTAL --  CKMB --  CKMBINDEX --  TROPONINI <0.30    BNP (last 3 results)  Basename 05/05/12 1754  PROBNP 718.9*   CBG:  Lab 05/05/12 1558  GLUCAP 82    Radiological Exams on Admission: Dg Chest 2 View  05/05/2012  *RADIOLOGY REPORT*  Clinical Data: Smoker presenting with dehydration.  Remote history of testicular cancer.  CHEST - 2 VIEW  Comparison: Two-view chest x-ray 03/13/2008.  Findings: Cardiomediastinal silhouette unremarkable, unchanged. Biapical pleuroparenchymal scarring, and parenchymal scarring in the right upper lobe, unchanged.  No new pulmonary parenchymal abnormalities.  No pleural effusions.  Degenerative changes involving the thoracic spine.  Allowing for differences in technique, no significant interval change.  IMPRESSION: No acute cardiopulmonary disease.  Stable biapical  pleuroparenchymal scarring and parenchymal scarring in the right upper lobe.  Original Report Authenticated By: Arnell Sieving, M.D.    EKG: Independently reviewed.   Assessment/Plan Principal Problem:  *Pre-syncope Active Problems:  Systolic murmur  Abnormal x-ray of lung  Carotid artery disease   Presyncope This is likely secondary to dehydration, as patient was working in basically all day outdoors in the heat of the sun, the normal blood work argues against that. I'll do 2-D echocardiogram, bilateral carotid duplex because patient history of carotid disease. Patient will be on telemetry, there was question about left bundle branch block, repeat ECG in a.m. and rule out acute coronary syndrome by 3 sets of cardiac enzymes.  Systolic murmur Patient mentioned that he have murmur as far as he knows before 2009 when his primary care doctor at that time referred him to a cardiologist, then we detected the abnormality of his right carotid artery and he ended up with right CEA. He was following at that time was [facility].  Carotid artery disease As mentioned above right-sided CEA done in 2009 by Dr Myra Gianotti.  Abnormal x-ray of the lungs Has what appears to be like chronic scarring of both of the apices of the lungs, patient supposed to get CT scan previously, I will get CT scan of the chest, also as a reason to rule  out syncopal episode secondary to cardiopulmonary problem like pulmonary embolism.  Code Status: Full code Family Communication: His brother Christen Bame was at bedside, I explained to both patient and his brother. Disposition Plan: Telemetry, likely to be discharged home he is clinically stable.  Time spent: 60 minutes  [facility] Pager [phone]  If 7PM-7AM, please contact night-coverage [email or web address] Password [facility] 05/05/2012, 9:11 PM

## 2012-05-05 NOTE — ED Notes (Signed)
Placed fall risk bracelet on pt.   

## 2012-05-05 NOTE — ED Notes (Signed)
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 Pt states has only had a couple soft drinks today, no water. Denies dizziness, shortness of breath, nausea/vomiting. Pt states "I feel fine". Pt in no distress.

## 2012-05-06 DIAGNOSIS — R55 Syncope and collapse: Secondary | ICD-10-CM

## 2012-05-06 DIAGNOSIS — R911 Solitary pulmonary nodule: Secondary | ICD-10-CM | POA: Diagnosis present

## 2012-05-06 LAB — CBC
HCT: 39.3 % (ref 39.0–52.0)
Platelets: 188 10*3/uL (ref 150–400)
RDW: 14.7 % (ref 11.5–15.5)
WBC: 6.2 10*3/uL (ref 4.0–10.5)

## 2012-05-06 LAB — CARDIAC PANEL(CRET KIN+CKTOT+MB+TROPI)
Relative Index: INVALID (ref 0.0–2.5)
Total CK: 75 U/L (ref 7–232)

## 2012-05-06 LAB — COMPREHENSIVE METABOLIC PANEL
Alkaline Phosphatase: 73 U/L (ref 39–117)
BUN: 9 mg/dL (ref 6–23)
GFR calc Af Amer: >90 mL/min (ref >90)
GFR calc non Af Amer: >90 mL/min (ref >90)
Glucose, Bld: 89 mg/dL (ref 70–99)
Potassium: 3.5 mEq/L (ref 3.5–5.1)
Total Bilirubin: 0.3 mg/dL (ref 0.3–1.2)
Total Protein: 6.5 g/dL (ref 6.0–8.3)

## 2012-05-06 LAB — HEMOGLOBIN A1C: Hgb A1c MFr Bld: 5.7 % — ABNORMAL HIGH (ref <5.7)

## 2012-05-06 MED ORDER — HYDROCODONE-ACETAMINOPHEN 5-325 MG PO TABS: 1.0000 | ORAL_TABLET | ORAL | Status: AC | PRN

## 2012-05-06 MED ORDER — MAGNESIUM SULFATE 40 MG/ML IJ SOLN
2.0000 g | Freq: Once | INTRAMUSCULAR | Status: AC
  Administered 2012-05-06: 2 g via INTRAVENOUS
  Filled 2012-05-06 (×2): qty 50

## 2012-05-06 NOTE — Discharge Summary (Signed)
Physician Discharge Summary  JOSE CORVIN EXB:[id number] DOB: 02/10/1951 DOA: 05/05/2012  PCP: Pt follows at [address] but is not sure what the name of the doctor is  Admit date: 05/05/2012 Discharge date: 05/06/2012  Recommendations for Outpatient Follow-up:  1. Pt will need to follow up with PCP in 2-3 weeks post discharge 2. Please obtain Mg level to make sure it is stable and within normal limits 3. Mg was low at 1.3 upon hospital admission and was supplemented but pt insisted on going home so no repeat Mg level could be obtained 4. Please also check CBC to evaluate Hg and Hct levels, was stable and within normal limits during the hospital stay 5. Please also note that pt had CT chest done which showed LLL nodule and given the fact he is smoker he will need repeat CT chest as noted below on imaging studies 6. Please also evaluate compliance with medications 7. Readdress and encourage smoking cessation as was also done during the hospital stay 8. 2 D ECHO pending on discharge and will have to be followed up to ensure no acute abnormalities noted that could have explained pre syncopal event   Discharge Diagnoses: Pre syncopal event secondary to volume depletion  Principal Problem:  *Pre-syncope - unclear etiology but possibly related to dehydration as pt reported poor oral intake - CE's x 2 are negative and no events on telemetry noted overnight - Doppler of the carotid vessels negative except external carotid on the left occlusion - this was discussed with neurology on call and I was told that this would not explain the pre syncopal event - 2 D ECHO is pending at the time of the discharge and given pt's systolic murmur this is certainly worrisome for cardiac pathology - I have discussed this with pt and recommended that he stays her in the hospital until the work is completed but he insisted on going home today - this will need to be followed by PCP  Active Problems:  Carotid  artery disease - risk factors discussed in detail - pt will continue Aspirin - smoking cessation encouraged   Lung nodule, left lower lobe - this was discussed with patient  - he is an active smoker - needs CT chest follow up in    Systolic murmur - 2/6 and unclear if this is new finding for pt - certainly worrisome in the setting of pre syncopal event - this was discussed with pt - 2 D ECHO pending at the time of the discharge   Hypomagnesemia - supplemented prior to discharge - needs to be followed up by PCP - addressed with pt  Discharge Condition: Stable  Diet recommendation: Heart healthy diet discussed in details   History of present illness:  Pt is 61 yo male who presented 08/11 with main concern of feeling weak and nearly passing out prior to actual admission. He explains he was outside and smoking, was not drinking or eating much and he suddenly felt weak and almost passed out. He denies any chest pain or shortness of breath at that time, no fevers, no chills, no abdominal or urinary concerns, no similar episodes in the past, no headaches or visual changes.  Hospital Course:  Principal Problem:  *Pre-syncope Active Problems:  Carotid artery disease  Lung nodule  Systolic murmur  Hypomagnesemia    Procedures/Studies: Dg Chest 2 View  05/05/2012  *RADIOLOGY REPORT*  Clinical Data: Smoker presenting with dehydration.  Remote history of testicular cancer.  CHEST - 2  VIEW  Comparison: Two-view chest x-ray 03/13/2008.  Findings: Cardiomediastinal silhouette unremarkable, unchanged. Biapical pleuroparenchymal scarring, and parenchymal scarring in the right upper lobe, unchanged.  No new pulmonary parenchymal abnormalities.  No pleural effusions.  Degenerative changes involving the thoracic spine.  Allowing for differences in technique, no significant interval change.  IMPRESSION: No acute cardiopulmonary disease.  Stable biapical pleuroparenchymal scarring and parenchymal  scarring in the right upper lobe.  Original Report Authenticated By: Arnell Sieving, M.D.   Ct Angio Chest Pe W/cm &/or Wo Cm  05/05/2012  *RADIOLOGY REPORT*  Clinical Data: Syncope.  Pulmonary parenchymal scarring.  Pulmonary embolism.  CT ANGIOGRAPHY CHEST  Technique:  Multidetector CT imaging of the chest using the standard protocol during bolus administration of intravenous contrast. Multiplanar reconstructed images including MIPs were obtained and reviewed to evaluate the vascular anatomy.  Contrast:  80 ml Omnipaque 350  Comparison: Chest radiograph 05/05/2012.  Findings: No pulmonary embolus.  Technically adequate study.  The heart appears within normal limits aside from atherosclerosis and coronary artery disease. Coronary artery atherosclerosis is present. If office based assessment of coronary risk factors has not been performed, it is now recommended.  Mild aortic atherosclerosis without aneurysm.  No acute vascular abnormality. Incidental imaging of the upper abdomen is within normal limits. The dependent atelectasis.  Apical predominant pulmonary parenchymal scarring is present.  No calcified plaques are present. Tenacious secretions are present in the right side of the trachea. Scattered areas of confluent subpleural scarring are present. There is a discrete pulmonary nodule measuring 5 mm in the lateral left lower lobe.  This is subpleural (image 73 series 7).  There are no aggressive osseous lesions.  No airspace disease. Paraseptal emphysema is present at the apices.  Likely chronic T8 thoracic compression fracture.  Multilevel Schmorl's nodes. Small areas of scattered mucoid impaction are present, notable in the left lower lobe (image number 57 series 7).  IMPRESSION: 1. Negative for pulmonary embolism. 2.  Apical predominant subpleural pulmonary parenchymal scarring. 3.  5 mm left lower lobe subpleural pulmonary nodule. If the patient is at high risk for bronchogenic carcinoma, follow-up  chest CT at 6-12 months is recommended.  If the patient is at low risk for bronchogenic carcinoma, follow-up chest CT at 12 months is recommended.  This recommendation follows the consensus statement: Guidelines for Management of Small Pulmonary Nodules Detected on CT Scans: A Statement from the Fleischner Society as published in Radiology 2005; 237:395-400. 4.  Coronary artery atherosclerosis.  Original Report Authenticated By: Andreas Newport, M  Consultations:  Neurology consultation over the phone  Antibiotics:  None  Discharge Exam: Filed Vitals:   05/06/12 0547  BP: 136/75  Pulse: 76  Temp: 97.6 F (36.4 C)  Resp: 18   Filed Vitals:   05/05/12 1622 05/05/12 1951 05/05/12 2145 05/06/12 0547  BP: 110/59 132/74 138/77 136/75  Pulse: 87 89 86 76  Temp:  97.6 F (36.4 C) 97.8 F (36.6 C) 97.6 F (36.4 C)  TempSrc:  Oral Oral Oral  Resp:  12 16 18   Height:   5\' 8"  (1.727 m)   Weight:   74.027 kg (163 lb 3.2 oz)   SpO2:  94% 99% 97%    General: Pt is alert, follows commands appropriately, not in acute distress Cardiovascular: Regular rate and rhythm, S1/S2 +, no murmurs, no rubs, no gallops Respiratory: Clear to auscultation bilaterally, no wheezing, no crackles, no rhonchi Abdominal: Soft, non tender, non distended, bowel sounds +, no guarding Extremities: no  edema, no cyanosis, pulses palpable bilaterally DP and PT Neuro: Grossly nonfocal  Discharge Instructions  Discharge Orders    Future Orders Please Complete By Expires   Diet - low sodium heart healthy      Increase activity slowly        Medication List  As of 05/06/2012 10:25 AM   TAKE these medications         aspirin 81 MG tablet   Take 81 mg by mouth daily.      HYDROcodone-acetaminophen 5-325 MG per tablet   Commonly known as: NORCO/VICODIN   Take 1-2 tablets by mouth every 4 (four) hours as needed.           Follow-up Information    Follow up with [facility]   Contact  information:   [address] [place] [address]           The results of significant diagnostics from this hospitalization (including imaging, microbiology, ancillary and laboratory) are listed below for reference.     Microbiology: No results found for this or any previous visit (from the past 240 hour(s)).   Labs: Basic Metabolic Panel:  Lab 05/06/12 8657 05/05/12 2255 05/05/12 1754  NA 138 -- 136  K 3.5 -- 4.4  CL 104 -- 102  CO2 25 -- 23  GLUCOSE 89 -- 79  BUN 9 -- 10  CREATININE 0.75 -- 0.87  CALCIUM 8.3* -- 9.2  MG -- 1.3* --  PHOS -- 3.5 --   Liver Function Tests:  Lab 05/06/12 0655 05/05/12 1754  AST 15 23  ALT 12 15  ALKPHOS 73 91  BILITOT 0.3 0.3  PROT 6.5 7.9  ALBUMIN 2.8* 3.7   No results found for this basename: LIPASE:5,AMYLASE:5 in the last 168 hours No results found for this basename: AMMONIA:5 in the last 168 hours CBC:  Lab 05/06/12 0655 05/05/12 1754  WBC 6.2 10.3  NEUTROABS -- 7.3  HGB 12.7* 15.1  HCT 39.3 45.1  MCV 91.2 90.7  PLT 188 198   Cardiac Enzymes:  Lab 05/06/12 0655 05/05/12 2255 05/05/12 1754  CKTOTAL 75 86 --  CKMB 2.5 2.6 --  CKMBINDEX -- -- --  TROPONINI <0.30 <0.30 <0.30   BNP: BNP (last 3 results)  Basename 05/05/12 1754  PROBNP 718.9*   CBG:  Lab 05/05/12 1558  GLUCAP 82     SIGNED: Time coordinating discharge: Over 30 minutes  Debbora Presto, MD  [facility] 05/06/2012, 10:25 AM Pager [phone]  If 7PM-7AM, please contact night-coverage [email or web address] Password [email or web address]

## 2012-05-06 NOTE — Progress Notes (Signed)
  Echocardiogram 2D Echocardiogram has been performed.  Jordan Johnston 05/06/2012, 10:01 AM

## 2012-05-06 NOTE — Progress Notes (Signed)
VASCULAR LAB PRELIMINARY  PRELIMINARY  PRELIMINARY  PRELIMINARY  Carotid duplex  completed.    Preliminary report:   Right:  No evidence of hemodynamically significant internal carotid artery stenosis.  CEA patent.   Left:  No evidence of hemodynamically significant internal carotid artery stenosis.  Severe external carotid artery stenosis. Bilateral:  Vertebral artery flow is antegrade.     Taneeka Curtner, RVT 05/06/2012, 9:36 AM

## 2012-07-04 DIAGNOSIS — Z23 Encounter for immunization: Secondary | ICD-10-CM

## 2012-07-30 ENCOUNTER — Encounter: Payer: Self-pay | Admitting: Vascular Surgery

## 2013-07-04 ENCOUNTER — Ambulatory Visit (INDEPENDENT_AMBULATORY_CARE_PROVIDER_SITE_OTHER): Payer: 59

## 2013-07-04 DIAGNOSIS — Z23 Encounter for immunization: Secondary | ICD-10-CM

## 2015-07-26 ENCOUNTER — Emergency Department (HOSPITAL_COMMUNITY): Payer: 59

## 2015-07-26 ENCOUNTER — Observation Stay (HOSPITAL_COMMUNITY)
Admission: EM | Admit: 2015-07-26 | Discharge: 2015-08-01 | Disposition: A | Discharge status: Home / Self Care | Payer: 59 | Attending: Cardiovascular Disease | Admitting: Cardiovascular Disease

## 2015-07-26 ENCOUNTER — Encounter (HOSPITAL_COMMUNITY): Payer: Self-pay | Admitting: Physical Medicine and Rehabilitation

## 2015-07-26 DIAGNOSIS — R911 Solitary pulmonary nodule: Secondary | ICD-10-CM

## 2015-07-26 DIAGNOSIS — F17210 Nicotine dependence, cigarettes, uncomplicated: Secondary | ICD-10-CM | POA: Insufficient documentation

## 2015-07-26 DIAGNOSIS — I5021 Acute systolic (congestive) heart failure: Secondary | ICD-10-CM

## 2015-07-26 DIAGNOSIS — I25119 Atherosclerotic heart disease of native coronary artery with unspecified angina pectoris: Secondary | ICD-10-CM

## 2015-07-26 DIAGNOSIS — I509 Heart failure, unspecified: Secondary | ICD-10-CM

## 2015-07-26 DIAGNOSIS — Z7982 Long term (current) use of aspirin: Secondary | ICD-10-CM | POA: Diagnosis not present

## 2015-07-26 DIAGNOSIS — I350 Nonrheumatic aortic (valve) stenosis: Secondary | ICD-10-CM | POA: Insufficient documentation

## 2015-07-26 DIAGNOSIS — Z23 Encounter for immunization: Secondary | ICD-10-CM | POA: Diagnosis not present

## 2015-07-26 DIAGNOSIS — N179 Acute kidney failure, unspecified: Secondary | ICD-10-CM | POA: Insufficient documentation

## 2015-07-26 DIAGNOSIS — I6529 Occlusion and stenosis of unspecified carotid artery: Secondary | ICD-10-CM

## 2015-07-26 DIAGNOSIS — Z8547 Personal history of malignant neoplasm of testis: Secondary | ICD-10-CM | POA: Insufficient documentation

## 2015-07-26 DIAGNOSIS — I10 Essential (primary) hypertension: Secondary | ICD-10-CM | POA: Diagnosis not present

## 2015-07-26 DIAGNOSIS — Z79899 Other long term (current) drug therapy: Secondary | ICD-10-CM | POA: Insufficient documentation

## 2015-07-26 DIAGNOSIS — I2510 Atherosclerotic heart disease of native coronary artery without angina pectoris: Secondary | ICD-10-CM | POA: Insufficient documentation

## 2015-07-26 DIAGNOSIS — I959 Hypotension, unspecified: Secondary | ICD-10-CM | POA: Diagnosis not present

## 2015-07-26 DIAGNOSIS — E876 Hypokalemia: Secondary | ICD-10-CM | POA: Insufficient documentation

## 2015-07-26 DIAGNOSIS — E789 Disorder of lipoprotein metabolism, unspecified: Secondary | ICD-10-CM | POA: Diagnosis not present

## 2015-07-26 DIAGNOSIS — I5023 Acute on chronic systolic (congestive) heart failure: Secondary | ICD-10-CM | POA: Diagnosis present

## 2015-07-26 DIAGNOSIS — E785 Hyperlipidemia, unspecified: Secondary | ICD-10-CM | POA: Diagnosis not present

## 2015-07-26 HISTORY — DX: Heart failure, unspecified: I50.9

## 2015-07-26 HISTORY — DX: Essential (primary) hypertension: I10

## 2015-07-26 HISTORY — DX: Reserved for inherently not codable concepts without codable children: IMO0001

## 2015-07-26 HISTORY — DX: Atherosclerotic heart disease of native coronary artery without angina pectoris: I25.10

## 2015-07-26 HISTORY — DX: Hyperlipidemia, unspecified: E78.5

## 2015-07-26 LAB — CBC WITH DIFFERENTIAL/PLATELET
BASOS ABS: 0 10*3/uL (ref 0.0–0.1)
Basophils Relative: 0 %
EOS ABS: 0 10*3/uL (ref 0.0–0.7)
EOS PCT: 0 %
HCT: 36.9 % — ABNORMAL LOW (ref 39.0–52.0)
HEMOGLOBIN: 11.5 g/dL — AB (ref 13.0–17.0)
LYMPHS ABS: 1 10*3/uL (ref 0.7–4.0)
Lymphocytes Relative: 19 %
MCH: 27.1 pg (ref 26.0–34.0)
MCHC: 31.2 g/dL (ref 30.0–36.0)
MCV: 87 fL (ref 78.0–100.0)
Monocytes Absolute: 0.5 10*3/uL (ref 0.1–1.0)
Monocytes Relative: 11 %
NEUTROS PCT: 70 %
Neutro Abs: 3.5 10*3/uL (ref 1.7–7.7)
PLATELETS: 218 10*3/uL (ref 150–400)
RBC: 4.24 MIL/uL (ref 4.22–5.81)
RDW: 18.3 % — ABNORMAL HIGH (ref 11.5–15.5)
WBC: 5.1 10*3/uL (ref 4.0–10.5)

## 2015-07-26 LAB — CBC
HEMATOCRIT: 37.2 % — AB (ref 39.0–52.0)
Hemoglobin: 11.5 g/dL — ABNORMAL LOW (ref 13.0–17.0)
MCH: 26.9 pg (ref 26.0–34.0)
MCHC: 30.9 g/dL (ref 30.0–36.0)
MCV: 86.9 fL (ref 78.0–100.0)
PLATELETS: 221 10*3/uL (ref 150–400)
RBC: 4.28 MIL/uL (ref 4.22–5.81)
RDW: 18 % — AB (ref 11.5–15.5)
WBC: 8.1 10*3/uL (ref 4.0–10.5)

## 2015-07-26 LAB — BRAIN NATRIURETIC PEPTIDE: B NATRIURETIC PEPTIDE 5: 1749.5 pg/mL — AB (ref 0.0–100.0)

## 2015-07-26 LAB — COMPREHENSIVE METABOLIC PANEL
ALBUMIN: 2.9 g/dL — AB (ref 3.5–5.0)
ALK PHOS: 156 U/L — AB (ref 38–126)
ALT: 27 U/L (ref 17–63)
ANION GAP: 12 (ref 5–15)
AST: 25 U/L (ref 15–41)
BUN: 16 mg/dL (ref 6–20)
CHLORIDE: 103 mmol/L (ref 101–111)
CO2: 23 mmol/L (ref 22–32)
Calcium: 8.7 mg/dL — ABNORMAL LOW (ref 8.9–10.3)
Creatinine, Ser: 1.27 mg/dL — ABNORMAL HIGH (ref 0.61–1.24)
GFR calc non Af Amer: 58 mL/min — ABNORMAL LOW (ref >60)
Glucose, Bld: 100 mg/dL — ABNORMAL HIGH (ref 65–99)
POTASSIUM: 3.3 mmol/L — AB (ref 3.5–5.1)
SODIUM: 138 mmol/L (ref 135–145)
TOTAL PROTEIN: 6.9 g/dL (ref 6.5–8.1)
Total Bilirubin: 1.2 mg/dL (ref 0.3–1.2)

## 2015-07-26 LAB — CREATININE, SERUM
Creatinine, Ser: 1.3 mg/dL — ABNORMAL HIGH (ref 0.61–1.24)
GFR calc non Af Amer: 57 mL/min — ABNORMAL LOW (ref >60)

## 2015-07-26 LAB — MAGNESIUM: Magnesium: 1.1 mg/dL — ABNORMAL LOW (ref 1.7–2.4)

## 2015-07-26 LAB — I-STAT TROPONIN, ED: TROPONIN I, POC: 0.01 ng/mL (ref 0.00–0.08)

## 2015-07-26 LAB — PROTIME-INR
INR: 1.39 (ref 0.00–1.49)
PROTHROMBIN TIME: 17.2 s — AB (ref 11.6–15.2)

## 2015-07-26 LAB — TROPONIN I: Troponin I: 0.03 ng/mL (ref <0.031)

## 2015-07-26 LAB — TSH: TSH: 7.572 u[IU]/mL — AB (ref 0.350–4.500)

## 2015-07-26 MED ORDER — POTASSIUM CHLORIDE CRYS ER 20 MEQ PO TBCR
40.0000 meq | EXTENDED_RELEASE_TABLET | Freq: Once | ORAL | Status: AC
Start: 2015-07-26 — End: 2015-07-27
  Administered 2015-07-27: 40 meq via ORAL
  Filled 2015-07-26: qty 2

## 2015-07-26 MED ORDER — ONDANSETRON HCL 4 MG/2ML IJ SOLN: 4.0000 mg | Freq: Four times a day (QID) | INTRAMUSCULAR | Status: DC | PRN

## 2015-07-26 MED ORDER — NITROGLYCERIN 0.4 MG SL SUBL
0.4000 mg | SUBLINGUAL_TABLET | SUBLINGUAL | Status: DC | PRN
Start: 2015-07-26 — End: 2015-08-01

## 2015-07-26 MED ORDER — SODIUM CHLORIDE 0.9 % IJ SOLN: 3.0000 mL | INTRAMUSCULAR | Status: DC | PRN

## 2015-07-26 MED ORDER — HEPARIN SODIUM (PORCINE) 5000 UNIT/ML IJ SOLN
5000.0000 [IU] | Freq: Three times a day (TID) | INTRAMUSCULAR | Status: DC
  Administered 2015-07-26 – 2015-07-27 (×2): 5000 [IU] via SUBCUTANEOUS
  Filled 2015-07-26 (×2): qty 1

## 2015-07-26 MED ORDER — OMEGA-3-ACID ETHYL ESTERS 1 G PO CAPS
1000.0000 mg | ORAL_CAPSULE | Freq: Every day | ORAL | Status: DC
  Administered 2015-07-27 – 2015-08-01 (×6): 1000 mg via ORAL
  Filled 2015-07-26 (×7): qty 1

## 2015-07-26 MED ORDER — ALBUTEROL SULFATE (2.5 MG/3ML) 0.083% IN NEBU: 2.5000 mg | INHALATION_SOLUTION | Freq: Four times a day (QID) | RESPIRATORY_TRACT | Status: DC | PRN

## 2015-07-26 MED ORDER — SODIUM CHLORIDE 0.9 % IV SOLN: 250.0000 mL | INTRAVENOUS | Status: DC | PRN

## 2015-07-26 MED ORDER — ACETAMINOPHEN 325 MG PO TABS: 650.0000 mg | ORAL_TABLET | ORAL | Status: DC | PRN

## 2015-07-26 MED ORDER — SODIUM CHLORIDE 0.9 % IJ SOLN
3.0000 mL | Freq: Two times a day (BID) | INTRAMUSCULAR | Status: DC
  Administered 2015-07-26 – 2015-08-01 (×7): 3 mL via INTRAVENOUS

## 2015-07-26 MED ORDER — ATORVASTATIN CALCIUM 80 MG PO TABS
80.0000 mg | ORAL_TABLET | Freq: Every day | ORAL | Status: DC
  Administered 2015-07-27 – 2015-07-31 (×5): 80 mg via ORAL
  Filled 2015-07-26 (×5): qty 1

## 2015-07-26 MED ORDER — LISINOPRIL 5 MG PO TABS
5.0000 mg | ORAL_TABLET | Freq: Every day | ORAL | Status: DC
  Administered 2015-07-27: 5 mg via ORAL
  Filled 2015-07-26 (×2): qty 1

## 2015-07-26 MED ORDER — POTASSIUM CHLORIDE CRYS ER 20 MEQ PO TBCR
20.0000 meq | EXTENDED_RELEASE_TABLET | Freq: Every day | ORAL | Status: DC
  Administered 2015-07-27: 20 meq via ORAL
  Filled 2015-07-26: qty 1

## 2015-07-26 MED ORDER — FUROSEMIDE 10 MG/ML IJ SOLN
40.0000 mg | Freq: Every day | INTRAMUSCULAR | Status: DC
  Administered 2015-07-27: 40 mg via INTRAVENOUS
  Filled 2015-07-26: qty 4

## 2015-07-26 MED ORDER — ASPIRIN EC 81 MG PO TBEC
81.0000 mg | DELAYED_RELEASE_TABLET | Freq: Every day | ORAL | Status: DC
  Administered 2015-07-26 – 2015-07-27 (×2): 81 mg via ORAL
  Filled 2015-07-26 (×2): qty 1

## 2015-07-26 MED ORDER — POTASSIUM CHLORIDE CRYS ER 20 MEQ PO TBCR
40.0000 meq | EXTENDED_RELEASE_TABLET | Freq: Once | ORAL | Status: AC
  Administered 2015-07-26: 40 meq via ORAL
  Filled 2015-07-26: qty 2

## 2015-07-26 MED ORDER — FUROSEMIDE 10 MG/ML IJ SOLN
40.0000 mg | Freq: Once | INTRAMUSCULAR | Status: AC
  Administered 2015-07-26: 40 mg via INTRAVENOUS
  Filled 2015-07-26: qty 4

## 2015-07-26 MED ORDER — ASPIRIN EC 81 MG PO TBEC: 81.0000 mg | DELAYED_RELEASE_TABLET | Freq: Every day | ORAL | Status: DC

## 2015-07-26 NOTE — ED Notes (Signed)
Pt presents to department for evaluation of SOB and bilateral lower leg swelling. Denies chest pain. Respirations unlabored. Pt is alert and oriented x4.

## 2015-07-26 NOTE — H&P (Signed)
Patient ID: Jordan Johnston MRN: [id number], DOB/AGE: 03-12-1951   Admit date: 07/26/2015   Primary Physician: No primary care provider on file. Primary Cardiologist: Dr. Gwenlyn Found   Pt. Profile:  Jordan Johnston is a 64 y.o. male with a history of testicular cancer, HTN, HLD, carotid stenosis s/p R CEA (2009), tobacco abuse, LV dysfunction (EF 40-45% in 04/2015), CAD with known 100% LCx occlusion, and mild AS/ mild MR who presented to [facility] today with SOB and LE edema.   Per old notes ( 2009) he does have a significant coronary history. He has a known 100% proximal circumflex occlusion and a 30% proximal LAD lesion, as well as a 90% ostial diagonal lesion. EF 40%. Not sure when the cardiac catheterization was done. He was admitted for pre syncope in 04/2012. He had a 2D ECHO which revealed 40-45%, mild concentric hypertrophy and probable moderate hypokinesis of the entireinferior and inferoseptal myocardium, G2DD, mild AS, mild MR, and mod LA dilation. The patient left AMA before this was resulted. He was also noted to have a pulmonary nodule in the LLL on CT and follow up was recommended in 6-12 months. He never followed up on this.   He was seen by an urgent care MD ~ 1 month ago and diagnosed with PNA and placed on Abx. Per patient about 2 weeks ago he noted worsening LE edema, SOB and orthopnea. Patient has worsening shortness of breath with exertion as well as at night. He denies chest pain. He does not follow with a PCP and only sees the urgent care dr when needed.    Problem List  Past Medical History  Diagnosis Date  . History of testicular cancer     Remote, 30 yrs +  . Hernia   . History of CEA (carotid endarterectomy)     Right-sided in 2009    History reviewed. No pertinent past surgical history.   Allergies  No Known Allergies   Home Medications  Prior to Admission medications   Medication Sig Start Date End Date Taking? Authorizing Provider  albuterol (PROVENTIL  HFA;VENTOLIN HFA) 108 (90 BASE) MCG/ACT inhaler Inhale 2 puffs into the lungs every 6 (six) hours as needed for wheezing or shortness of breath.   Yes Historical Provider, MD  aspirin 81 MG tablet Take 81 mg by mouth daily.   Yes Historical Provider, MD  NIACIN PO Take 1 tablet by mouth daily.   Yes Historical Provider, MD  Omega-3 Fatty Acids (FISH OIL) 1000 MG CAPS Take 1,000 mg by mouth daily.   Yes Historical Provider, MD    Family History  Family History  Problem Relation Age of Onset  . Diabetes Mother   . Alzheimer's disease Mother   . Coronary artery disease Father   . Melanoma Father    No family status information on file.     Social History  Social History   Social History  . Marital Status: Single    Spouse Name: N/A  . Number of Children: N/A  . Years of Education: N/A   Occupational History  . Not on file.   Social History Main Topics  . Smoking status: Current Every Day Smoker -- 1.00 packs/day for 40 years    Types: Cigarettes  . Smokeless tobacco: Not on file  . Alcohol Use: No  . Drug Use: Not on file  . Sexual Activity: Not on file   Other Topics Concern  . Not on file   Social History  Narrative     All other systems reviewed and are otherwise negative except as noted above.  Physical Exam  Blood pressure 105/69, pulse 102, temperature 97.6 F (36.4 C), temperature source Oral, resp. rate 20, height 5\' 10"  (1.778 m), weight 189 lb (85.73 kg), SpO2 98 %.  General: Pleasant, NAD. Looks older than stated age Psych: Normal affect. Neuro: Alert and oriented X 3. Moves all extremities spontaneously. HEENT: Normal  Neck: Supple without bruits or +JVD. Lungs:  Resp regular and unlabored, decreased breath sounds at bases Heart: RRR no s3, s4, or soft SEM. Abdomen: Soft, non-tender, non-distended, BS + x 4.  Extremities: No clubbing, cyanosis . DP/PT/Radials 2+ and equal bilaterally. 2+ pitting edema above knees bilaterally  Labs  No results  for input(s): CKTOTAL, CKMB, TROPONINI in the last 72 hours. Lab Results  Component Value Date   WBC 5.1 07/26/2015   HGB 11.5* 07/26/2015   HCT 36.9* 07/26/2015   MCV 87.0 07/26/2015   PLT 218 07/26/2015    Recent Labs Lab 07/26/15 1410  NA 138  K 3.3*  CL 103  CO2 23  BUN 16  CREATININE 1.27*  CALCIUM 8.7*  PROT 6.9  BILITOT 1.2  ALKPHOS 156*  ALT 27  AST 25  GLUCOSE 100*   No results found for: CHOL, HDL, LDLCALC, TRIG No results found for: DDIMER   Radiology/Studies  Dg Chest 2 View  07/26/2015  CLINICAL DATA:  Shortness of breath, history of presyncope, cardiac murmur, current smoker. EXAM: CHEST  2 VIEW COMPARISON:  Chest x-ray and chest CT scan of May 05, 2012. FINDINGS: The lungs are adequately inflated. The interstitial markings are increased bilaterally. There small bilateral pleural effusions. There is apical pleural thickening which is stable. The cardiac silhouette is enlarged. The pulmonary vascularity is mildly engorged and indistinct. There is tortuosity of the descending thoracic aorta. The mediastinum is normal in width. The bony thorax exhibits no acute abnormalities. IMPRESSION: CHF with moderate pulmonary interstitial edema and small bilateral pleural effusions. Follow-up radiographs following anticipated antibiotic therapy are recommended. Electronically Signed   By: David  Martinique M.D.   On: 07/26/2015 15:32    2D ECHO  Study Date: 05/06/2012 LV EF: 40 - 45% Study Conclusions - Left ventricle: The cavity size was normal. There was mild   concentric hypertrophy. Systolic function was mildly to   moderately reduced. The estimated ejection fraction was in   the range of 40% to 45%. Probable moderate hypokinesis of   the entireinferior and inferoseptal myocardium. Features   are consistent with a pseudonormal left ventricular   filling pattern, with concomitant abnormal relaxation and   increased filling pressure (grade 2 diastolic   dysfunction).  Doppler parameters are consistent with   elevated mean left atrial filling pressure. - Aortic valve: There was mild stenosis. Valve area:   1.57cm^2(VTI). Valve area: 1.43cm^2 (Vmax). - Mitral valve: Calcified annulus. Mildly thickened leaflets   . The findings are consistent with trivial stenosis. Mild   regurgitation. Valve area by pressure half-time: 1.96cm^2.   Valve area by continuity equation (using LVOT flow):   1.78cm^2. - Left atrium: The atrium was moderately dilated.   ECG HR 102 Sinus tachycardia LAD, LBBB   ASSESSMENT AND PLAN  THESEUS BIRNIE is a 64 y.o. male with a history of testicular cancer, HTN, HLD, carotid stenosis s/p R CEA (2009), tobacco abuse, LV dysfunction (EF 40-45% in 04/2015), CAD with known 100% LCx occlusion, and mild AS/ mild MR who presented  to [facility] today with SOB and LE edema.   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  -- BNP elevated to 1749. CXR with pulmonary vascular congestion. S/s CHF. -- Given one dose IV lasix 40mg  in the ED.  Will start 40mg  IV Lasix tomorrow AM.  -- Strict I/Os. Daily weights.  -- Will start lisinopril 5 mg. No BB during acute CHF exacerbation. -- Will try to get a 2D ECHO early tomorrow AM.   CAD- Per old notes (2009) he does have a significant coronary history. He has a known 100% proximal circumflex occlusion and a 30% proximal LAD lesion, as well as a 90% ostial diagonal lesion. EF 40%. Not sure when the cardiac catheterization was. -- ASA and statin. Will need BB when fluid status is optimized.  -- Troponin negative x1. Continue to cycle enzymes -- If EF significantly lower, will likely need a LHC.   Pulmonary nodule- CT in 2013 w/ a 5 mm left lower lobe subpleural pulmonary nodule. Follow up in 6-12 months was recommended. This was never done. Will put this off for now in case he needs a LHC with contrast dye. But this should done at some point.  HTN- Will start ACE.    HLD- will get a fasting lipid panel tomorrow.   Hypokalemia- will supplement.   SignedEileen Stanford, PA-C 07/26/2015, 4:31 PM  Pager [phone]  I have seen and examined the patient along with THOMPSON, KATHRYN R, PA-C.  I have reviewed the chart, notes and new data.  I agree with PA's note.  Key new complaints: still has dyspnea and orthopnea Key examination changes: bibasilar reduced breath sounds, R>L, distant heart sounds, nor murmur, possible S3; 3+ pitting edema to knees in both legs Key new findings / data: mild hypokalemia and elevated creatinine  Report of cath in 2012?) by Dr. Elisabeth Cara, described in Dr. Stephens Shire pre-CEA H&P states he had 100% occluded LCX, 90% ostial diagonal and mild proximal LAD stenoses. At that time his EF was 40%. He has never had angina.  PLAN:  Diuretics. ACEi. ASA and statin. DC niacin. Repeat echo, if EF has dropped, recommend repeat coronary angio. If EF unchanged, nuclear perfusion study. Needs much closer follow up.  Additional issues include need for periodic carotid US f/u (none since 2013) and need for reevaluation of LLL pulmonary nodule seen on CT 2013.  Sanda Klein, MD, [place] [phone] 07/26/2015, 6:12 PM

## 2015-07-26 NOTE — ED Provider Notes (Signed)
CSN: [id number]     Arrival date & time 07/26/15  1348 History   First MD Initiated Contact with Patient 07/26/15 1502     Chief Complaint  Patient presents with  . Shortness of Breath  . Leg Swelling     (Consider location/radiation/quality/duration/timing/severity/associated sxs/prior Treatment) The history is provided by the patient.  Jordan Johnston is a 64 y.o. male hx of testicular cancer, aortic stenosis, CHF here presenting with worsening shortness of breath, leg swelling. Short of breath that he going on for the last month or so. Patient was diagnosed with possible pneumonia and finished a course of Z-Pak about 3 weeks ago. However, the shortness of breath has persisted. Patient has worsening shortness of breath with exertion as well as at night. Also has worsening leg swelling for the last 3-4 weeks as well. Denies any fevers or chills or cough. Had echo 3 years ago when he had syncope and showed EF of 40% and mild aortic stenosis. He does not have any primary care doctor and has not followed up since then.     Past Medical History  Diagnosis Date  . History of testicular cancer     Remote, 30 yrs +  . Hernia   . History of CEA (carotid endarterectomy)     Right-sided in 2009   History reviewed. No pertinent past surgical history. Family History  Problem Relation Age of Onset  . Diabetes Mother   . Alzheimer's disease Mother   . Coronary artery disease Father   . Melanoma Father    Social History  Substance Use Topics  . Smoking status: Current Every Day Smoker -- 1.00 packs/day for 40 years    Types: Cigarettes  . Smokeless tobacco: None  . Alcohol Use: No    Review of Systems  Respiratory: Positive for shortness of breath.   All other systems reviewed and are negative.     Allergies  Review of patient's allergies indicates no known allergies.  Home Medications   Prior to Admission medications   Medication Sig Start Date End Date Taking? Authorizing  Provider  albuterol (PROVENTIL HFA;VENTOLIN HFA) 108 (90 BASE) MCG/ACT inhaler Inhale 2 puffs into the lungs every 6 (six) hours as needed for wheezing or shortness of breath.   Yes Historical Provider, MD  aspirin 81 MG tablet Take 81 mg by mouth daily.   Yes Historical Provider, MD  NIACIN PO Take 1 tablet by mouth daily.   Yes Historical Provider, MD  Omega-3 Fatty Acids (FISH OIL) 1000 MG CAPS Take 1,000 mg by mouth daily.   Yes Historical Provider, MD   BP 115/72 mmHg  Pulse 99  Temp(Src) 97.6 F (36.4 C) (Oral)  Resp 31  Ht 5\' 10"  (1.778 m)  Wt 189 lb (85.73 kg)  BMI 27.12 kg/m2  SpO2 93% Physical Exam  Constitutional: He is oriented to person, place, and time. He appears well-developed.  Chronically ill   HENT:  Head: Normocephalic.  Mouth/Throat: Oropharynx is clear and moist.  Eyes: Conjunctivae are normal. Pupils are equal, round, and reactive to light.  Neck: Normal range of motion. Neck supple.  Cardiovascular: Normal rate.   2/6 systolic murmur   Pulmonary/Chest: Effort normal.  Diminished bilateral bases, no wheezing   Abdominal: Soft. Bowel sounds are normal. He exhibits no distension. There is no tenderness. There is no rebound.  Musculoskeletal:  2+ edema bilateral legs   Neurological: He is alert and oriented to person, place, and time.  Skin: Skin is  warm and dry.  Psychiatric: He has a normal mood and affect. His behavior is normal. Judgment and thought content normal.  Nursing note and vitals reviewed.   ED Course  Procedures (including critical care time) Labs Review Labs Reviewed  CBC WITH DIFFERENTIAL/PLATELET - Abnormal; Notable for the following:    Hemoglobin 11.5 (*)    HCT 36.9 (*)    RDW 18.3 (*)    All other components within normal limits  COMPREHENSIVE METABOLIC PANEL - Abnormal; Notable for the following:    Potassium 3.3 (*)    Glucose, Bld 100 (*)    Creatinine, Ser 1.27 (*)    Calcium 8.7 (*)    Albumin 2.9 (*)    Alkaline  Phosphatase 156 (*)    GFR calc non Af Amer 58 (*)    All other components within normal limits  BRAIN NATRIURETIC PEPTIDE - Abnormal; Notable for the following:    B Natriuretic Peptide 1749.5 (*)    All other components within normal limits  I-STAT TROPOININ, ED    Imaging Review Dg Chest 2 View  07/26/2015  CLINICAL DATA:  Shortness of breath, history of presyncope, cardiac murmur, current smoker. EXAM: CHEST  2 VIEW COMPARISON:  Chest x-ray and chest CT scan of May 05, 2012. FINDINGS: The lungs are adequately inflated. The interstitial markings are increased bilaterally. There small bilateral pleural effusions. There is apical pleural thickening which is stable. The cardiac silhouette is enlarged. The pulmonary vascularity is mildly engorged and indistinct. There is tortuosity of the descending thoracic aorta. The mediastinum is normal in width. The bony thorax exhibits no acute abnormalities. IMPRESSION: CHF with moderate pulmonary interstitial edema and small bilateral pleural effusions. Follow-up radiographs following anticipated antibiotic therapy are recommended. Electronically Signed   By: David  Martinique M.D.   On: 07/26/2015 15:32   I have personally reviewed and evaluated these images and lab results as part of my medical decision-making.   EKG Interpretation   Date/Time:  Monday July 26 2015 13:55:15 EDT Ventricular Rate:  102 PR Interval:  162 QRS Duration: 124 QT Interval:  382 QTC Calculation: 497 R Axis:   -33 Text Interpretation:  Sinus tachycardia Left axis deviation Left bundle  branch block Abnormal ECG No significant change since last tracing  Confirmed by YAO  MD, DAVID ([id number]) on 07/26/2015 3:03:48 PM      MDM   Final diagnoses:  None   Jordan Johnston is a 64 y.o. male here with shortness of breath, leg swelling. Likely worsening CHF, possibly from worsening aortic stenosis. Will get labs, CXR, BNP. Will likely need diuresis.   5:03 PM CXR showed  CHF. BNP 1700. Given lasix 40 mg IV. Will admit for echo, diuresis.     Wandra Arthurs, MD 07/26/15 [phone]

## 2015-07-26 NOTE — ED Notes (Signed)
Attempted report X1

## 2015-07-26 NOTE — ED Notes (Signed)
Attempted report X2 

## 2015-07-27 ENCOUNTER — Encounter (HOSPITAL_COMMUNITY): Payer: Self-pay | Admitting: General Practice

## 2015-07-27 ENCOUNTER — Encounter (HOSPITAL_COMMUNITY)
Admission: EM | Disposition: A | Discharge status: Home / Self Care | Payer: 59 | Source: Home / Self Care | Attending: Emergency Medicine

## 2015-07-27 ENCOUNTER — Observation Stay (HOSPITAL_BASED_OUTPATIENT_CLINIC_OR_DEPARTMENT_OTHER): Payer: 59

## 2015-07-27 DIAGNOSIS — I509 Heart failure, unspecified: Secondary | ICD-10-CM

## 2015-07-27 DIAGNOSIS — E876 Hypokalemia: Secondary | ICD-10-CM

## 2015-07-27 DIAGNOSIS — I350 Nonrheumatic aortic (valve) stenosis: Secondary | ICD-10-CM | POA: Insufficient documentation

## 2015-07-27 DIAGNOSIS — I2510 Atherosclerotic heart disease of native coronary artery without angina pectoris: Secondary | ICD-10-CM | POA: Diagnosis not present

## 2015-07-27 DIAGNOSIS — E789 Disorder of lipoprotein metabolism, unspecified: Secondary | ICD-10-CM

## 2015-07-27 DIAGNOSIS — I5023 Acute on chronic systolic (congestive) heart failure: Secondary | ICD-10-CM | POA: Diagnosis not present

## 2015-07-27 HISTORY — PX: CARDIAC CATHETERIZATION: SHX172

## 2015-07-27 LAB — POCT I-STAT 3, VENOUS BLOOD GAS (G3P V)
ACID-BASE EXCESS: 4 mmol/L — AB (ref 0.0–2.0)
Acid-Base Excess: 3 mmol/L — ABNORMAL HIGH (ref 0.0–2.0)
Acid-Base Excess: 3 mmol/L — ABNORMAL HIGH (ref 0.0–2.0)
BICARBONATE: 26.5 meq/L — AB (ref 20.0–24.0)
BICARBONATE: 27.4 meq/L — AB (ref 20.0–24.0)
BICARBONATE: 28.1 meq/L — AB (ref 20.0–24.0)
O2 SAT: 55 %
O2 SAT: 58 %
O2 Saturation: 93 %
PCO2 VEN: 41.5 mmHg — AB (ref 45.0–50.0)
PH VEN: 7.463 — AB (ref 7.250–7.300)
PO2 VEN: 62 mmHg — AB (ref 30.0–45.0)
PO2 VEN: 28 mmHg — AB (ref 30.0–45.0)
PO2 VEN: 29 mmHg — AB (ref 30.0–45.0)
TCO2: 28 mmol/L (ref 0–100)
TCO2: 29 mmol/L (ref 0–100)
TCO2: 29 mmol/L (ref 0–100)
pCO2, Ven: 36.9 mmHg — ABNORMAL LOW (ref 45.0–50.0)
pCO2, Ven: 41.5 mmHg — ABNORMAL LOW (ref 45.0–50.0)
pH, Ven: 7.428 — ABNORMAL HIGH (ref 7.250–7.300)
pH, Ven: 7.44 — ABNORMAL HIGH (ref 7.250–7.300)

## 2015-07-27 LAB — COMPREHENSIVE METABOLIC PANEL
ALT: 26 U/L (ref 17–63)
AST: 31 U/L (ref 15–41)
Albumin: 2.8 g/dL — ABNORMAL LOW (ref 3.5–5.0)
Alkaline Phosphatase: 142 U/L — ABNORMAL HIGH (ref 38–126)
Anion gap: 10 (ref 5–15)
BILIRUBIN TOTAL: 2.2 mg/dL — AB (ref 0.3–1.2)
BUN: 13 mg/dL (ref 6–20)
CO2: 24 mmol/L (ref 22–32)
Calcium: 8.4 mg/dL — ABNORMAL LOW (ref 8.9–10.3)
Chloride: 102 mmol/L (ref 101–111)
Creatinine, Ser: 1.15 mg/dL (ref 0.61–1.24)
Glucose, Bld: 102 mg/dL — ABNORMAL HIGH (ref 65–99)
POTASSIUM: 4.2 mmol/L (ref 3.5–5.1)
Sodium: 136 mmol/L (ref 135–145)
TOTAL PROTEIN: 6.7 g/dL (ref 6.5–8.1)

## 2015-07-27 LAB — CBC
HCT: 34.3 % — ABNORMAL LOW (ref 39.0–52.0)
Hemoglobin: 10.6 g/dL — ABNORMAL LOW (ref 13.0–17.0)
MCH: 26.6 pg (ref 26.0–34.0)
MCHC: 30.9 g/dL (ref 30.0–36.0)
MCV: 86.2 fL (ref 78.0–100.0)
PLATELETS: 231 10*3/uL (ref 150–400)
RBC: 3.98 MIL/uL — AB (ref 4.22–5.81)
RDW: 18.3 % — AB (ref 11.5–15.5)
WBC: 7.8 10*3/uL (ref 4.0–10.5)

## 2015-07-27 LAB — LIPID PANEL
Cholesterol: 99 mg/dL (ref 0–200)
HDL: 25 mg/dL — AB (ref >40)
LDL CALC: 62 mg/dL (ref 0–99)
Total CHOL/HDL Ratio: 4 RATIO
Triglycerides: 60 mg/dL (ref <150)
VLDL: 12 mg/dL (ref 0–40)

## 2015-07-27 LAB — TROPONIN I
TROPONIN I: 0.03 ng/mL (ref <0.031)
TROPONIN I: 0.03 ng/mL (ref <0.031)

## 2015-07-27 LAB — PROTIME-INR
INR: 1.41 (ref 0.00–1.49)
Prothrombin Time: 17.3 seconds — ABNORMAL HIGH (ref 11.6–15.2)

## 2015-07-27 LAB — T4, FREE: FREE T4: 1.2 ng/dL — AB (ref 0.61–1.12)

## 2015-07-27 SURGERY — RIGHT/LEFT HEART CATH AND CORONARY/GRAFT ANGIOGRAPHY

## 2015-07-27 MED ORDER — SODIUM CHLORIDE 0.9 % IJ SOLN
3.0000 mL | Freq: Two times a day (BID) | INTRAMUSCULAR | Status: DC
  Administered 2015-07-27 – 2015-08-01 (×9): 3 mL via INTRAVENOUS

## 2015-07-27 MED ORDER — ASPIRIN 81 MG PO CHEW: 81.0000 mg | CHEWABLE_TABLET | ORAL | Status: DC

## 2015-07-27 MED ORDER — ACETAMINOPHEN 325 MG PO TABS: 650.0000 mg | ORAL_TABLET | ORAL | Status: DC | PRN

## 2015-07-27 MED ORDER — PNEUMOCOCCAL VAC POLYVALENT 25 MCG/0.5ML IJ INJ
0.5000 mL | INJECTION | INTRAMUSCULAR | Status: AC
  Administered 2015-07-29: 0.5 mL via INTRAMUSCULAR
  Filled 2015-07-27: qty 0.5

## 2015-07-27 MED ORDER — IOHEXOL 350 MG/ML SOLN
INTRAVENOUS | Status: DC | PRN
  Administered 2015-07-27: 70 mL via INTRACARDIAC

## 2015-07-27 MED ORDER — SODIUM CHLORIDE 0.9 % IJ SOLN
3.0000 mL | INTRAMUSCULAR | Status: DC | PRN
Start: 2015-07-27 — End: 2015-08-01

## 2015-07-27 MED ORDER — HEPARIN SODIUM (PORCINE) 5000 UNIT/ML IJ SOLN
5000.0000 [IU] | Freq: Three times a day (TID) | INTRAMUSCULAR | Status: DC
Start: 2015-07-27 — End: 2015-08-01
  Administered 2015-07-27 – 2015-08-01 (×14): 5000 [IU] via SUBCUTANEOUS
  Filled 2015-07-27 (×14): qty 1

## 2015-07-27 MED ORDER — SODIUM CHLORIDE 0.9 % IJ SOLN
3.0000 mL | Freq: Two times a day (BID) | INTRAMUSCULAR | Status: DC
  Administered 2015-07-27: 3 mL via INTRAVENOUS

## 2015-07-27 MED ORDER — FENTANYL CITRATE (PF) 100 MCG/2ML IJ SOLN
INTRAMUSCULAR | Status: DC | PRN
Start: 2015-07-27 — End: 2015-07-27
  Administered 2015-07-27: 25 ug via INTRAVENOUS

## 2015-07-27 MED ORDER — ASPIRIN 81 MG PO CHEW
81.0000 mg | CHEWABLE_TABLET | Freq: Every day | ORAL | Status: DC
  Administered 2015-07-28 – 2015-08-01 (×5): 81 mg via ORAL
  Filled 2015-07-27 (×5): qty 1

## 2015-07-27 MED ORDER — SODIUM CHLORIDE 0.9 % IV SOLN: 250.0000 mL | INTRAVENOUS | Status: DC | PRN

## 2015-07-27 MED ORDER — SODIUM CHLORIDE 0.9 % IV SOLN
INTRAVENOUS | Status: DC | PRN
  Administered 2015-07-27: 10 mL/h via INTRAVENOUS

## 2015-07-27 MED ORDER — MIDAZOLAM HCL 2 MG/2ML IJ SOLN
INTRAMUSCULAR | Status: DC | PRN
  Administered 2015-07-27: 1 mg via INTRAVENOUS

## 2015-07-27 MED ORDER — INFLUENZA VAC SPLIT QUAD 0.5 ML IM SUSY
0.5000 mL | PREFILLED_SYRINGE | INTRAMUSCULAR | Status: AC
  Administered 2015-07-29: 0.5 mL via INTRAMUSCULAR

## 2015-07-27 MED ORDER — SODIUM CHLORIDE 0.9 % IV SOLN: INTRAVENOUS | Status: DC

## 2015-07-27 MED ORDER — FENTANYL CITRATE (PF) 100 MCG/2ML IJ SOLN
INTRAMUSCULAR | Status: AC
  Filled 2015-07-27: qty 4

## 2015-07-27 MED ORDER — SODIUM CHLORIDE 0.9 % IV SOLN: INTRAVENOUS | Status: AC

## 2015-07-27 MED ORDER — ONDANSETRON HCL 4 MG/2ML IJ SOLN: 4.0000 mg | Freq: Four times a day (QID) | INTRAMUSCULAR | Status: DC | PRN

## 2015-07-27 MED ORDER — LIDOCAINE HCL (PF) 1 % IJ SOLN
INTRAMUSCULAR | Status: AC
  Filled 2015-07-27: qty 30

## 2015-07-27 MED ORDER — SODIUM CHLORIDE 0.9 % IJ SOLN: 3.0000 mL | INTRAMUSCULAR | Status: DC | PRN

## 2015-07-27 MED ORDER — HEPARIN (PORCINE) IN NACL 2-0.9 UNIT/ML-% IJ SOLN
INTRAMUSCULAR | Status: AC
  Filled 2015-07-27: qty 1500

## 2015-07-27 MED ORDER — MIDAZOLAM HCL 2 MG/2ML IJ SOLN
INTRAMUSCULAR | Status: AC
  Filled 2015-07-27: qty 4

## 2015-07-27 SURGICAL SUPPLY — 11 items
CATH INFINITI 5FR MULTPACK ANG (CATHETERS) ×3 IMPLANT
CATH SWAN GANZ 7F STRAIGHT (CATHETERS) ×3 IMPLANT
KIT HEART LEFT (KITS) ×3 IMPLANT
KIT HEART RIGHT NAMIC (KITS) ×3 IMPLANT
PACK CARDIAC CATHETERIZATION (CUSTOM PROCEDURE TRAY) ×3 IMPLANT
SHEATH PINNACLE 5F 10CM (SHEATH) ×3 IMPLANT
SHEATH PINNACLE 7F 10CM (SHEATH) ×3 IMPLANT
SYR MEDRAD MARK V 150ML (SYRINGE) ×3 IMPLANT
TRANSDUCER W/STOPCOCK (MISCELLANEOUS) ×3 IMPLANT
TUBING CIL FLEX 10 FLL-RA (TUBING) ×3 IMPLANT
WIRE EMERALD 3MM-J .035X150CM (WIRE) ×3 IMPLANT

## 2015-07-27 NOTE — Progress Notes (Signed)
Heart Failure Navigator Consult Note  Presentation: Jordan Johnston is a 64 y.o. male with a history of testicular cancer, HTN, HLD, carotid stenosis s/p R CEA (2009), tobacco abuse, LV dysfunction (EF 40-45% in 04/2015), CAD with known 100% LCx occlusion, and mild AS/ mild MR who presented to [facility] today with SOB and LE edema.   Per old notes ( 2009) he does have a significant coronary history. He has a known 100% proximal circumflex occlusion and a 30% proximal LAD lesion, as well as a 90% ostial diagonal lesion. EF 40%. Not sure when the cardiac catheterization was done. He was admitted for pre syncope in 04/2012. He had a 2D ECHO which revealed 40-45%, mild concentric hypertrophy and probable moderate hypokinesis of the entireinferior and inferoseptal myocardium, G2DD, mild AS, mild MR, and mod LA dilation. The patient left AMA before this was resulted. He was also noted to have a pulmonary nodule in the LLL on CT and follow up was recommended in 6-12 months. He never followed up on this.   He was seen by an urgent care MD ~ 1 month ago and diagnosed with PNA and placed on Abx. Per patient about 2 weeks ago he noted worsening LE edema, SOB and orthopnea. Patient has worsening shortness of breath with exertion as well as at night. He denies chest pain. He does not follow with a PCP and only sees the urgent care dr when needed.   Past Medical History  Diagnosis Date  . History of testicular cancer     Remote, 30 yrs +  . Hernia   . History of CEA (carotid endarterectomy)     Right-sided in 2009  . HTN (hypertension)   . HLD (hyperlipidemia)   . CAD (coronary artery disease)     Social History   Social History  . Marital Status: Single    Spouse Name: N/A  . Number of Children: N/A  . Years of Education: N/A   Social History Main Topics  . Smoking status: Current Every Day Smoker -- 1.00 packs/day for 40 years    Types: Cigarettes  . Smokeless tobacco: None  . Alcohol Use: No  .  Drug Use: None  . Sexual Activity: Not Asked   Other Topics Concern  . None   Social History Narrative    ECHO:Study Conclusions--07/27/15  - Left ventricle: The cavity size was mildly dilated. The estimated ejection fraction was in the range of 10% to 15%. Severe diffuse hypokinesis with distinct regional wall motion abnormalities. Akinesis of the basal-midinferolateral, inferior, and inferoseptal myocardium. - Aortic valve: Transvalvular velocity was increased less than expected, due to low cardiac output. There was mild stenosis. Valve area (VTI): 1.37 cm^2. Valve area (Vmax): 1.35 cm^2. Valve area (Vmean): 1.29 cm^2. - Mitral valve: Calcified annulus. Mildly thickened leaflets . There was mild regurgitation. Valve area by continuity equation (using LVOT flow): 2.88 cm^2. - Left atrium: The atrium was moderately to severely dilated. - Right ventricle: Wall thickness was moderately increased. Systolic function was moderately reduced. - Right atrium: The atrium was moderately dilated. - Tricuspid valve: There was moderate regurgitation. - Pulmonary arteries: Systolic pressure was moderately increased. PA peak pressure: 48 mm Hg (S). - Pericardium, extracardiac: There was a left pleural effusion.  Impressions:  - Compared to 2013 study, there is interval severe deterioration in LV systolic and diastolic function.  Transthoracic echocardiography. M-mode, complete 2D, spectral Doppler, and color Doppler. Birthdate: Patient birthdate: [birth year]. Age: Patient is 64 yr old. Sex:  Gender: male. BMI: 24.5 kg/m^2. Blood pressure:   106/68 Patient status: Inpatient. Study date: Study date: 07/27/2015. Study time: 09:07 AM. Location: Bedside.  BNP    Component Value Date/Time   BNP 1749.5* 07/26/2015 1410    ProBNP    Component Value Date/Time   PROBNP 718.9* 05/05/2012 1754     Education Assessment and Provision:  Detailed  education and instructions provided on heart failure disease management including the following:  Signs and symptoms of Heart Failure When to call the physician Importance of daily weights Low sodium diet Fluid restriction Medication management Anticipated future follow-up appointments  Patient education given on each of the above topics.  Patient acknowledges understanding and acceptance of all instructions.  I spoke briefly with Jordan Johnston regarding his HF.  He says that he has never been told in the past that he has HF.  He says that he was recently diagnosed with PNA at an urgent care and took a course of antibiotics--then tells me that he was called back and told that he had no signs of PNA but asked to go to ED for his ongoing SOB.  He says he "thinks" he has a scale at home--yet does not weigh daily.  I will provide a scale for home use.  I briefly reviewed the importance of daily weights and how they relate to the signs and symptoms of HF.  He admits that he eats out most meals (mostly cafeteria style).  I encouraged him to become much more aware of sodium in his diet and how that relates to fluid retention.   He tells me that he has had problems affording medications in the past and only reports taking fish oil prior to admission.  He works full-time on [employer] and reports no issues with that or daily activities until about 1 month ago.  He lives alone in [place].  Education Materials:  "Living Better With Heart Failure" Booklet, Daily Weight Tracker Tool and Heart Failure Educational Video.   High Risk Criteria for Readmission and/or Poor Patient Outcomes:  (Recommend Follow-up with [facility])- yes patient would benefit AHF Team referral    EF <30%- Yes 10-15% EF  2 or more admissions in 6 months- No  Difficult social situation- ?No-Lives alone  Demonstrates medication noncompliance- describes problems affording prescribed  medications    Barriers of Care:  Health Literacy, Knowledge, Finances and Compliance  Discharge Planning:  Plans to return to home alone.  He will need ongoing support and education.  He will also need compliance reinforcement, and possibly medication assistance.

## 2015-07-27 NOTE — Progress Notes (Signed)
  Echocardiogram 2D Echocardiogram has been performed.  Jordan Johnston 07/27/2015, 9:56 AM

## 2015-07-27 NOTE — Progress Notes (Signed)
Attempted IV in left forearm x 1.  Attempt not successful. Notified IV team.

## 2015-07-27 NOTE — Progress Notes (Signed)
Site area: right groin a 5 french arterial and a 7 french venous sheath was removed  Site Prior to Removal:  Level 0  Pressure Applied For 15 MINUTES    Minutes Beginning at 1435p  Manual:   Yes.    Patient Status During Pull:  stable  Post Pull Groin Site:  Level 0  Post Pull Instructions Given:  Yes.    Post Pull Pulses Present:  Yes.    Dressing Applied:  Yes.    Comments:  VS remain stable during  sheath pull

## 2015-07-27 NOTE — Progress Notes (Signed)
Patient Name: Jordan Johnston Date of Encounter: 07/27/2015   SUBJECTIVE  Breathing improved, however still not at baseline. Doing echo at bedside.   CURRENT MEDS . aspirin EC  81 mg Oral Daily  . atorvastatin  80 mg Oral q1800  . furosemide  40 mg Intravenous Daily  . heparin  5,000 Units Subcutaneous 3 times per day  . lisinopril  5 mg Oral Daily  . omega-3 acid ethyl esters  1,000 mg Oral Daily  . potassium chloride  20 mEq Oral Daily  . sodium chloride  3 mL Intravenous Q12H    OBJECTIVE  Filed Vitals:   07/26/15 1900 07/26/15 2012 07/27/15 0401 07/27/15 0851  BP: 115/84 120/78 106/68 93/61  Pulse: 109 114 97 99  Temp:  97.7 F (36.5 C) 97.6 F (36.4 C) 98.2 F (36.8 C)  TempSrc:  Oral Oral Oral  Resp: 37 18 18 18   Height:  5\' 10"  (1.778 m)    Weight:  173 lb 3.2 oz (78.563 kg) 171 lb 3.2 oz (77.656 kg)   SpO2: 98% 96% 98% 92%    Intake/Output Summary (Last 24 hours) at 07/27/15 0935 Last data filed at 07/27/15 0848  Gross per 24 hour  Intake    600 ml  Output   3020 ml  Net  -2420 ml   Filed Weights   07/26/15 1402 07/26/15 2012 07/27/15 0401  Weight: 189 lb (85.73 kg) 173 lb 3.2 oz (78.563 kg) 171 lb 3.2 oz (77.656 kg)    PHYSICAL EXAM  General: Pleasant, NAD. Neuro: Alert and oriented X 3. Moves all extremities spontaneously. Psych: Normal affect. HEENT:  Normal  Neck: Supple without bruits. + JVD. Lungs:  Resp regular and unlabored. Diminished breath sound bibasilar.  Heart: RRR no s3, s4. Soft systolic murmurs. Abdomen: Soft, non-tender, non-distended, BS + x 4.  Extremities: No clubbing, cyanosis.2+ BL LE  edema. DP/PT/Radials 2+ and equal bilaterally.  Accessory Clinical Findings  CBC  Recent Labs  07/26/15 1410 07/26/15 2129 07/27/15 0318  WBC 5.1 8.1 7.8  NEUTROABS 3.5  --   --   HGB 11.5* 11.5* 10.6*  HCT 36.9* 37.2* 34.3*  MCV 87.0 86.9 86.2  PLT 218 221 762   Basic Metabolic Panel  Recent Labs  07/26/15 1410  07/26/15 2129 07/27/15 0318  NA 138  --  136  K 3.3*  --  4.2  CL 103  --  102  CO2 23  --  24  GLUCOSE 100*  --  102*  BUN 16  --  13  CREATININE 1.27* 1.30* 1.15  CALCIUM 8.7*  --  8.4*  MG  --  1.1*  --    Liver Function Tests  Recent Labs  07/26/15 1410 07/27/15 0318  AST 25 31  ALT 27 26  ALKPHOS 156* 142*  BILITOT 1.2 2.2*  PROT 6.9 6.7  ALBUMIN 2.9* 2.8*   No results for input(s): LIPASE, AMYLASE in the last 72 hours. Cardiac Enzymes  Recent Labs  07/26/15 2129 07/27/15 0318  TROPONINI 0.03 0.03   Fasting Lipid Panel  Recent Labs  07/27/15 0318  CHOL 99  HDL 25*  LDLCALC 62  TRIG 60  CHOLHDL 4.0   Thyroid Function Tests  Recent Labs  07/26/15 2129  TSH 7.572*    TELE  NSR  Radiology/Studies  Dg Chest 2 View  07/26/2015  CLINICAL DATA:  Shortness of breath, history of presyncope, cardiac murmur, current smoker. EXAM: CHEST  2 VIEW COMPARISON:  Chest x-ray and chest CT scan of May 05, 2012. FINDINGS: The lungs are adequately inflated. The interstitial markings are increased bilaterally. There small bilateral pleural effusions. There is apical pleural thickening which is stable. The cardiac silhouette is enlarged. The pulmonary vascularity is mildly engorged and indistinct. There is tortuosity of the descending thoracic aorta. The mediastinum is normal in width. The bony thorax exhibits no acute abnormalities. IMPRESSION: CHF with moderate pulmonary interstitial edema and small bilateral pleural effusions. Follow-up radiographs following anticipated antibiotic therapy are recommended. Electronically Signed   By: David  Martinique M.D.   On: 07/26/2015 15:32    ASSESSMENT AND PLAN   Jordan Johnston is a 64 y.o. male with a history of testicular cancer, HTN, HLD, carotid stenosis s/p R CEA (2009), tobacco abuse, LV dysfunction (EF 40-45% in 04/2015), CAD with known 100% LCx occlusion, and mild AS/ mild MR who presented to [facility] today with SOB and LE  edema.   1. 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  - BNP elevated to 1749. CXR with pulmonary vascular congestion. S/s CHF. - Given one dose IV lasix 40mg  in the ED. Now 40mg  IV Lasix daily. Diuresed 2.4L so far. Weight down 2lb.  - Continue lisinopril 5 mg. No BB during acute CHF exacerbation. Scr normal.  -2D ECHO currently undergoing.   2. CAD- Per old notes (2009) he does have a significant coronary history. He has a known 100% proximal circumflex occlusion and a 30% proximal LAD lesion, as well as a 90% ostial diagonal lesion. EF 40%. Not sure when the cardiac catheterization was. - ASA and statin. Will need BB when fluid status is optimized.  -Troponin negative x3.  - If EF significantly lower, will  need a LHC. If EF unchanged, nuclear perfusion study. He has eaten today.   3. Pulmonary nodule- CT in 2013 w/ a 5 mm left lower lobe subpleural pulmonary nodule. Follow up in 6-12 months was recommended. This was never done. Will put this off for now in case he needs a LHC with contrast dye. But this should done at some point.  4. HTN- Will start ACE.   5. HLD -07/27/2015: Cholesterol 99; HDL 25*; LDL Cholesterol 62; Triglycerides 60; VLDL 12  - Continue statin  6. Hypokalemia - Resolved  7. AKI - Resolved with diuresis  8. High TSH - Will get Free T4 and T3  Signed, Bhagat,Bhavinkumar PA-C Pager [phone]  I have seen and examined the patient along with Bhagat,Bhavinkumar PA-C.  I have reviewed the chart, notes and new data.  I agree with PA's note.  Key new complaints: some improvement in breathing, no angina Key examination changes: still grossly hypervolemic Key new findings / data: echo shows a drastic reduction in LV function with global hypokinesis and inferior/inferoseptal/inferolateral akinesis. Note improving renal function in the face of active diuresis.  PLAN: Probably ischemic cardiomyopathy, suspect CAD  progression from previous evaluation. Coronary angio, R&L heart cath. Will need additional diuresis after cath and initiation of ACEi and beta blockers, gradually titrated to maximum tolerated dose. Will probably need AICD; timing will depend on cath findings and revascularization indication.  Sanda Klein, MD, [place] [phone] 07/27/2015, 10:28 AM

## 2015-07-27 NOTE — H&P (View-Only) (Signed)
Patient Name: Jordan Johnston Date of Encounter: 07/27/2015   SUBJECTIVE  Breathing improved, however still not at baseline. Doing echo at bedside.   CURRENT MEDS . aspirin EC  81 mg Oral Daily  . atorvastatin  80 mg Oral q1800  . furosemide  40 mg Intravenous Daily  . heparin  5,000 Units Subcutaneous 3 times per day  . lisinopril  5 mg Oral Daily  . omega-3 acid ethyl esters  1,000 mg Oral Daily  . potassium chloride  20 mEq Oral Daily  . sodium chloride  3 mL Intravenous Q12H    OBJECTIVE  Filed Vitals:   07/26/15 1900 07/26/15 2012 07/27/15 0401 07/27/15 0851  BP: 115/84 120/78 106/68 93/61  Pulse: 109 114 97 99  Temp:  97.7 F (36.5 C) 97.6 F (36.4 C) 98.2 F (36.8 C)  TempSrc:  Oral Oral Oral  Resp: 37 18 18 18   Height:  5\' 10"  (1.778 m)    Weight:  173 lb 3.2 oz (78.563 kg) 171 lb 3.2 oz (77.656 kg)   SpO2: 98% 96% 98% 92%    Intake/Output Summary (Last 24 hours) at 07/27/15 0935 Last data filed at 07/27/15 0848  Gross per 24 hour  Intake    600 ml  Output   3020 ml  Net  -2420 ml   Filed Weights   07/26/15 1402 07/26/15 2012 07/27/15 0401  Weight: 189 lb (85.73 kg) 173 lb 3.2 oz (78.563 kg) 171 lb 3.2 oz (77.656 kg)    PHYSICAL EXAM  General: Pleasant, NAD. Neuro: Alert and oriented X 3. Moves all extremities spontaneously. Psych: Normal affect. HEENT:  Normal  Neck: Supple without bruits. + JVD. Lungs:  Resp regular and unlabored. Diminished breath sound bibasilar.  Heart: RRR no s3, s4. Soft systolic murmurs. Abdomen: Soft, non-tender, non-distended, BS + x 4.  Extremities: No clubbing, cyanosis.2+ BL LE  edema. DP/PT/Radials 2+ and equal bilaterally.  Accessory Clinical Findings  CBC  Recent Labs  07/26/15 1410 07/26/15 2129 07/27/15 0318  WBC 5.1 8.1 7.8  NEUTROABS 3.5  --   --   HGB 11.5* 11.5* 10.6*  HCT 36.9* 37.2* 34.3*  MCV 87.0 86.9 86.2  PLT 218 221 194   Basic Metabolic Panel  Recent Labs  07/26/15 1410  07/26/15 2129 07/27/15 0318  NA 138  --  136  K 3.3*  --  4.2  CL 103  --  102  CO2 23  --  24  GLUCOSE 100*  --  102*  BUN 16  --  13  CREATININE 1.27* 1.30* 1.15  CALCIUM 8.7*  --  8.4*  MG  --  1.1*  --    Liver Function Tests  Recent Labs  07/26/15 1410 07/27/15 0318  AST 25 31  ALT 27 26  ALKPHOS 156* 142*  BILITOT 1.2 2.2*  PROT 6.9 6.7  ALBUMIN 2.9* 2.8*   No results for input(s): LIPASE, AMYLASE in the last 72 hours. Cardiac Enzymes  Recent Labs  07/26/15 2129 07/27/15 0318  TROPONINI 0.03 0.03   Fasting Lipid Panel  Recent Labs  07/27/15 0318  CHOL 99  HDL 25*  LDLCALC 62  TRIG 60  CHOLHDL 4.0   Thyroid Function Tests  Recent Labs  07/26/15 2129  TSH 7.572*    TELE  NSR  Radiology/Studies  Dg Chest 2 View  07/26/2015  CLINICAL DATA:  Shortness of breath, history of presyncope, cardiac murmur, current smoker. EXAM: CHEST  2 VIEW COMPARISON:  Chest x-ray and chest CT scan of May 05, 2012. FINDINGS: The lungs are adequately inflated. The interstitial markings are increased bilaterally. There small bilateral pleural effusions. There is apical pleural thickening which is stable. The cardiac silhouette is enlarged. The pulmonary vascularity is mildly engorged and indistinct. There is tortuosity of the descending thoracic aorta. The mediastinum is normal in width. The bony thorax exhibits no acute abnormalities. IMPRESSION: CHF with moderate pulmonary interstitial edema and small bilateral pleural effusions. Follow-up radiographs following anticipated antibiotic therapy are recommended. Electronically Signed   By: David  Martinique M.D.   On: 07/26/2015 15:32    ASSESSMENT AND PLAN   Jordan Johnston is a 64 y.o. male with a history of testicular cancer, HTN, HLD, carotid stenosis s/p R CEA (2009), tobacco abuse, LV dysfunction (EF 40-45% in 04/2015), CAD with known 100% LCx occlusion, and mild AS/ mild MR who presented to [facility] today with SOB and LE  edema.   1. 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  - BNP elevated to 1749. CXR with pulmonary vascular congestion. S/s CHF. - Given one dose IV lasix 40mg  in the ED. Now 40mg  IV Lasix daily. Diuresed 2.4L so far. Weight down 2lb.  - Continue lisinopril 5 mg. No BB during acute CHF exacerbation. Scr normal.  -2D ECHO currently undergoing.   2. CAD- Per old notes (2009) he does have a significant coronary history. He has a known 100% proximal circumflex occlusion and a 30% proximal LAD lesion, as well as a 90% ostial diagonal lesion. EF 40%. Not sure when the cardiac catheterization was. - ASA and statin. Will need BB when fluid status is optimized.  -Troponin negative x3.  - If EF significantly lower, will  need a LHC. If EF unchanged, nuclear perfusion study. He has eaten today.   3. Pulmonary nodule- CT in 2013 w/ a 5 mm left lower lobe subpleural pulmonary nodule. Follow up in 6-12 months was recommended. This was never done. Will put this off for now in case he needs a LHC with contrast dye. But this should done at some point.  4. HTN- Will start ACE.   5. HLD -07/27/2015: Cholesterol 99; HDL 25*; LDL Cholesterol 62; Triglycerides 60; VLDL 12  - Continue statin  6. Hypokalemia - Resolved  7. AKI - Resolved with diuresis  8. High TSH - Will get Free T4 and T3  Signed, Bhagat,Bhavinkumar PA-C Pager [phone]  I have seen and examined the patient along with Bhagat,Bhavinkumar PA-C.  I have reviewed the chart, notes and new data.  I agree with PA's note.  Key new complaints: some improvement in breathing, no angina Key examination changes: still grossly hypervolemic Key new findings / data: echo shows a drastic reduction in LV function with global hypokinesis and inferior/inferoseptal/inferolateral akinesis. Note improving renal function in the face of active diuresis.  PLAN: Probably ischemic cardiomyopathy, suspect CAD  progression from previous evaluation. Coronary angio, R&L heart cath. Will need additional diuresis after cath and initiation of ACEi and beta blockers, gradually titrated to maximum tolerated dose. Will probably need AICD; timing will depend on cath findings and revascularization indication.  Sanda Klein, MD, [place] [phone] 07/27/2015, 10:28 AM

## 2015-07-27 NOTE — Progress Notes (Signed)
Paged PA [facility] concerning patient having a 4 beat run of v-tach. Awaiting return call.

## 2015-07-27 NOTE — Interval H&P Note (Signed)
Cath Lab Visit (complete for each Cath Lab visit)  Clinical Evaluation Leading to the Procedure:   ACS: Yes.    Non-ACS:    Anginal Classification: CCS IV  Anti-ischemic medical therapy: Minimal Therapy (1 class of medications)  Non-Invasive Test Results: High-risk stress test findings: cardiac mortality >3%/year  Prior CABG: No previous CABG      History and Physical Interval Note:  07/27/2015 1:17 PM  Jordan Johnston  has presented today for surgery, with the diagnosis of cp  The various methods of treatment have been discussed with the patient and family. After consideration of risks, benefits and other options for treatment, the patient has consented to  Procedure(s): Right/Left Heart Cath and Coronary/Graft Angiography (N/A) as a surgical intervention .  The patient's history has been reviewed, patient examined, no change in status, stable for surgery.  I have reviewed the patient's chart and labs.  Questions were answered to the patient's satisfaction.     Aishi Courts S.

## 2015-07-28 ENCOUNTER — Encounter (HOSPITAL_COMMUNITY): Payer: Self-pay | Admitting: Interventional Cardiology

## 2015-07-28 DIAGNOSIS — I5023 Acute on chronic systolic (congestive) heart failure: Secondary | ICD-10-CM | POA: Diagnosis not present

## 2015-07-28 DIAGNOSIS — I25118 Atherosclerotic heart disease of native coronary artery with other forms of angina pectoris: Secondary | ICD-10-CM

## 2015-07-28 DIAGNOSIS — I350 Nonrheumatic aortic (valve) stenosis: Secondary | ICD-10-CM | POA: Diagnosis not present

## 2015-07-28 LAB — COMPREHENSIVE METABOLIC PANEL
ALT: 19 U/L (ref 17–63)
ANION GAP: 10 (ref 5–15)
AST: 18 U/L (ref 15–41)
Albumin: 2.4 g/dL — ABNORMAL LOW (ref 3.5–5.0)
Alkaline Phosphatase: 117 U/L (ref 38–126)
BUN: 13 mg/dL (ref 6–20)
CALCIUM: 8.2 mg/dL — AB (ref 8.9–10.3)
CHLORIDE: 99 mmol/L — AB (ref 101–111)
CO2: 27 mmol/L (ref 22–32)
CREATININE: 0.96 mg/dL (ref 0.61–1.24)
Glucose, Bld: 93 mg/dL (ref 65–99)
Potassium: 3.5 mmol/L (ref 3.5–5.1)
SODIUM: 136 mmol/L (ref 135–145)
Total Bilirubin: 2.1 mg/dL — ABNORMAL HIGH (ref 0.3–1.2)
Total Protein: 6.1 g/dL — ABNORMAL LOW (ref 6.5–8.1)

## 2015-07-28 LAB — T3, FREE: T3 FREE: 2.1 pg/mL (ref 2.0–4.4)

## 2015-07-28 LAB — CBC
HCT: 33.3 % — ABNORMAL LOW (ref 39.0–52.0)
Hemoglobin: 10.2 g/dL — ABNORMAL LOW (ref 13.0–17.0)
MCH: 26.3 pg (ref 26.0–34.0)
MCHC: 30.6 g/dL (ref 30.0–36.0)
MCV: 85.8 fL (ref 78.0–100.0)
PLATELETS: 210 10*3/uL (ref 150–400)
RBC: 3.88 MIL/uL — AB (ref 4.22–5.81)
RDW: 18.3 % — ABNORMAL HIGH (ref 11.5–15.5)
WBC: 6.1 10*3/uL (ref 4.0–10.5)

## 2015-07-28 LAB — HEMOGLOBIN A1C
Hgb A1c MFr Bld: 6.1 % — ABNORMAL HIGH (ref 4.8–5.6)
Mean Plasma Glucose: 128 mg/dL

## 2015-07-28 MED ORDER — FUROSEMIDE 10 MG/ML IJ SOLN
40.0000 mg | Freq: Every day | INTRAMUSCULAR | Status: DC
  Administered 2015-07-28 – 2015-07-30 (×3): 40 mg via INTRAVENOUS
  Filled 2015-07-28 (×4): qty 4

## 2015-07-28 MED ORDER — DIGOXIN 250 MCG PO TABS
0.2500 mg | ORAL_TABLET | Freq: Every day | ORAL | Status: DC
  Administered 2015-07-28 – 2015-08-01 (×5): 0.25 mg via ORAL
  Filled 2015-07-28 (×5): qty 1

## 2015-07-28 MED ORDER — SPIRONOLACTONE 25 MG PO TABS
12.5000 mg | ORAL_TABLET | Freq: Every day | ORAL | Status: DC
  Administered 2015-07-28 – 2015-07-29 (×2): 12.5 mg via ORAL
  Filled 2015-07-28 (×2): qty 1

## 2015-07-28 MED ORDER — POTASSIUM CHLORIDE CRYS ER 20 MEQ PO TBCR
20.0000 meq | EXTENDED_RELEASE_TABLET | Freq: Every day | ORAL | Status: DC
  Administered 2015-07-28 – 2015-08-01 (×5): 20 meq via ORAL
  Filled 2015-07-28 (×7): qty 1

## 2015-07-28 MED FILL — Heparin Sodium (Porcine) 2 Unit/ML in Sodium Chloride 0.9%: INTRAMUSCULAR | Qty: 1500 | Status: AC

## 2015-07-28 MED FILL — Lidocaine HCl Local Preservative Free (PF) Inj 1%: INTRAMUSCULAR | Qty: 30 | Status: AC

## 2015-07-28 NOTE — Care Management Note (Signed)
Case Management Note  Patient Details  Name: Jordan Johnston MRN: [id number] Date of Birth: [birth year]  Subjective/Objective:      Admitted with Heart Failure              Action/Plan: Patient is independent of ADL's, works full time, has private insurance with [facility] with prescription drug coverage. No PCP. CM explained the importance of having a PCP- patient will stated that he will call a PCP after discharged to become established with his insurance approval. Pharmacy of choice is [facility]. Patient has scales at home, does not cook but eats out a lot; CM explained the importance of monitoring his sodium intake. CM will continue to follow for DCP.  Expected Discharge Date:     possibly 07/30/2015             Expected Discharge Plan:  Home/Self Care  Discharge planning Services  CM Consult  Status of Service:  In process, will continue to follow  Royston Bake, RN 07/28/2015, 11:08 AM

## 2015-07-28 NOTE — Research (Signed)
[email or web address]'@[email or web address]'$  Informed Consent   Subject Name: Jordan Johnston  Subject met inclusion and exclusion criteria.  The informed consent form, study requirements and expectations were reviewed with the subject and questions and concerns were addressed prior to the signing of the consent form.  The subject verbalized understanding of the trail requirements.  The subject agreed to participate in the [email or web address]$  trial and signed the informed consent.  The informed consent was obtained prior to performance of any protocol-specific procedures for the subject.  A copy of the signed informed consent was given to the subject and a copy was placed in the subject's medical record.  [name] 07/28/2015, 10:00

## 2015-07-28 NOTE — Progress Notes (Signed)
Patient Name: Jordan Johnston Date of Encounter: 07/28/2015   SUBJECTIVE  Feeling well. No chest pain, sob or palpitations. Getting sponge bath.  Unable to titrate meds due to low BP.   CURRENT MEDS . aspirin  81 mg Oral Daily  . atorvastatin  80 mg Oral q1800  . furosemide  40 mg Intravenous Daily  . heparin  5,000 Units Subcutaneous 3 times per day  . Influenza vac split quadrivalent PF  0.5 mL Intramuscular Tomorrow-1000  . lisinopril  5 mg Oral Daily  . omega-3 acid ethyl esters  1,000 mg Oral Daily  . pneumococcal 23 valent vaccine  0.5 mL Intramuscular Tomorrow-1000  . potassium chloride  20 mEq Oral Daily  . sodium chloride  3 mL Intravenous Q12H  . sodium chloride  3 mL Intravenous Q12H    OBJECTIVE  Filed Vitals:   07/28/15 0616 07/28/15 0813 07/28/15 0820 07/28/15 1008  BP: 84/49 87/57 80/57  80/57  Pulse: 94  90   Temp: 98.6 F (37 C)  98 F (36.7 C)   TempSrc: Oral  Oral   Resp: 16  18   Height:      Weight:      SpO2: 91%  93%     Intake/Output Summary (Last 24 hours) at 07/28/15 1022 Last data filed at 07/28/15 1005  Gross per 24 hour  Intake    420 ml  Output    976 ml  Net   -556 ml   Filed Weights   07/26/15 2012 07/27/15 0401 07/28/15 0413  Weight: 173 lb 3.2 oz (78.563 kg) 171 lb 3.2 oz (77.656 kg) 165 lb 7 oz (75.043 kg)    PHYSICAL EXAM  General: Pleasant, NAD. Neuro: Alert and oriented X 3. Moves all extremities spontaneously. Psych: Normal affect. HEENT:  Normal  Neck: Supple without bruits or JVD. Lungs:  Resp regular and unlabored. CTA.  Heart: RRR no s3, s4. Soft systolic murmurs. Abdomen: Soft, non-tender, non-distended, BS + x 4.  Extremities: No clubbing, cyanosis. 2+ BL LE  edema. DP/PT/Radials 2+ and equal bilaterally.  Accessory Clinical Findings  CBC  Recent Labs  07/26/15 1410  07/27/15 0318 07/28/15 0341  WBC 5.1  < > 7.8 6.1  NEUTROABS 3.5  --   --   --   HGB 11.5*  < > 10.6* 10.2*  HCT 36.9*  < > 34.3*  33.3*  MCV 87.0  < > 86.2 85.8  PLT 218  < > 231 210  < > = values in this interval not displayed. Basic Metabolic Panel  Recent Labs  07/26/15 2129 07/27/15 0318 07/28/15 0341  NA  --  136 136  K  --  4.2 3.5  CL  --  102 99*  CO2  --  24 27  GLUCOSE  --  102* 93  BUN  --  13 13  CREATININE 1.30* 1.15 0.96  CALCIUM  --  8.4* 8.2*  MG 1.1*  --   --    Liver Function Tests  Recent Labs  07/27/15 0318 07/28/15 0341  AST 31 18  ALT 26 19  ALKPHOS 142* 117  BILITOT 2.2* 2.1*  PROT 6.7 6.1*  ALBUMIN 2.8* 2.4*   No results for input(s): LIPASE, AMYLASE in the last 72 hours. Cardiac Enzymes  Recent Labs  07/26/15 2129 07/27/15 0318 07/27/15 1033  TROPONINI 0.03 0.03 0.03   Fasting Lipid Panel  Recent Labs  07/27/15 0318  CHOL 99  HDL 25*  LDLCALC 62  TRIG 60  CHOLHDL 4.0   Thyroid Function Tests  Recent Labs  07/26/15 2129 07/27/15 1135  TSH 7.572*  --   T3FREE  --  2.1    TELE  NSR  Radiology/Studies  Dg Chest 2 View  07/26/2015  CLINICAL DATA:  Shortness of breath, history of presyncope, cardiac murmur, current smoker. EXAM: CHEST  2 VIEW COMPARISON:  Chest x-ray and chest CT scan of May 05, 2012. FINDINGS: The lungs are adequately inflated. The interstitial markings are increased bilaterally. There small bilateral pleural effusions. There is apical pleural thickening which is stable. The cardiac silhouette is enlarged. The pulmonary vascularity is mildly engorged and indistinct. There is tortuosity of the descending thoracic aorta. The mediastinum is normal in width. The bony thorax exhibits no acute abnormalities. IMPRESSION: CHF with moderate pulmonary interstitial edema and small bilateral pleural effusions. Follow-up radiographs following anticipated antibiotic therapy are recommended. Electronically Signed   By: David  Martinique M.D.   On: 07/26/2015 15:32   Echo 06/26/15 LV EF: 10% -   15%  ------------------------------------------------------------------- Indications:   CHF - 428.0.  ------------------------------------------------------------------- History:  PMH:  Murmur. Risk factors: LE edema. History of cancer.  ------------------------------------------------------------------- Study Conclusions  - Left ventricle: The cavity size was mildly dilated. The estimated ejection fraction was in the range of 10% to 15%. Severe diffuse hypokinesis with distinct regional wall motion abnormalities. Akinesis of the basal-midinferolateral, inferior, and inferoseptal myocardium. - Aortic valve: Transvalvular velocity was increased less than expected, due to low cardiac output. There was mild stenosis. Valve area (VTI): 1.37 cm^2. Valve area (Vmax): 1.35 cm^2. Valve area (Vmean): 1.29 cm^2. - Mitral valve: Calcified annulus. Mildly thickened leaflets . There was mild regurgitation. Valve area by continuity equation (using LVOT flow): 2.88 cm^2. - Left atrium: The atrium was moderately to severely dilated. - Right ventricle: Wall thickness was moderately increased. Systolic function was moderately reduced. - Right atrium: The atrium was moderately dilated. - Tricuspid valve: There was moderate regurgitation. - Pulmonary arteries: Systolic pressure was moderately increased. PA peak pressure: 48 mm Hg (S). - Pericardium, extracardiac: There was a left pleural effusion.  Impressions:  - Compared to 2013 study, there is interval severe deterioration in LV systolic and diastolic function.  Cath 07/27/15 Conclusion     Mid Cx lesion, 100% stenosed. Right to left collaterals fill a large OM2.  Mid RCA to Dist RCA lesion, 30% stenosed.  Ost 2nd Diag lesion, 70% stenosed. Mild disease in the LAD.  There is severe left ventricular systolic dysfunction.  Mild aortic valve gradient.  Known occlusion of the circumflex and moderate  diagonal disease, from prior catheterization done years ago. Significant decrease in left ventricular systolic function is not related to any new, significant coronary artery disease. Nonobstructive disease noted in the LAD and RCA. Continue medical therapy.     ASSESSMENT AND PLAN   AWESOME Jordan Johnston is a 64 y.o. male with a history of testicular cancer, HTN, HLD, carotid stenosis s/p R CEA (2009), tobacco abuse, LV dysfunction (EF 40-45% in 04/2015), CAD with known 100% LCx occlusion, and mild AS/ mild MR who presented to [facility] today with SOB and LE edema.   1. 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  - BNP elevated to 1749. CXR with pulmonary vascular congestion. S/s CHF. - On 40mg  IV Lasix daily. Diuresed 3.3 so far. Weight down 8lb. Seems inaccurate.   - Continue lisinopril 5 mg. No BB during acute CHF exacerbation. Scr  normal.  -2D ECHO showed severe deterioration in LV systolic and diastolic function, mild AS  2. CAD- Per old notes (2009) he does have a significant coronary history. He has a known 100% proximal circumflex occlusion and a 30% proximal LAD lesion, as well as a 90% ostial diagonal lesion. EF 40%.  - ASA and statin. - Cath showed Mid Cx lesion, 100% stenosed. Right to left collaterals fill a large OM2.  Mid RCA to Dist RCA lesion, 30% stenosed.  Ost 2nd Diag lesion, 70% stenosed. Mild disease in the LAD.  There is severe left ventricular systolic dysfunction.  Mild aortic valve gradient. - Plan for medical management. Unable to titrate meds due to low BP. AICD? Marland Kitchen MD to decide further.   3. Pulmonary nodule- CT in 2013 w/ a 5 mm left lower lobe subpleural pulmonary nodule. Follow up in 6-12 months was recommended. This was never done. Will put this off for now in case he needs a LHC with contrast dye. But this should done at some point.  4. HTN- - hypotensive today.   5. HLD -07/27/2015: Cholesterol 99; HDL 25*; LDL  Cholesterol 62; Triglycerides 60; VLDL 12  - Continue statin  6. Hypokalemia - Resolved  7. AKI - Resolved with diuresis  8. High TSH and free T4 - Normal Free T3 - IM consult? Or recheck lab as outpatient.   Signed, Bhagat,Bhavinkumar PA-C Pager [phone]   I have seen and examined the patient along with Charna Archer, PA NP.  I have reviewed the chart, notes and new data.  I agree with PA/NP's note.  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 Has low gradient AS, but I do not think that the AS is severe or a cause for cardiomyopathy. Consider dobutamine echo. Limited options for medical therapy due to low BP - he has not received any meds today due to BP. May need advanced HF therapies, maybe eventually even mechanical support. Will ask for Dr. Clayborne Dana advice.  Sanda Klein, MD, [place] [phone] 07/28/2015, 2:14 PM

## 2015-07-28 NOTE — Progress Notes (Signed)
Patient seen and examined.   Echo and cath films reviewed personally.   He has severe HF with marked interval decrease in his LV dysfunction. Visually he appears to have mild AS but could be worse. However, I agree with Dr. Recardo Evangelist that AS is likely not the major culprit here. Can consider dobutamine echo at some point.   He remains volume overloaded.   Will give IV lasix today. Stop lisinopril. Start digoxin and spiro. May need to move to SDU.   Social situation may prohibit advanced therapies but hopefully EF will improve with aggressive medical therapy if BP permits.   The HF team will assume care.   Discussed with Dr. Recardo Evangelist.   Bensimhon, Daniel,MD 3:38 PM

## 2015-07-29 ENCOUNTER — Observation Stay (HOSPITAL_COMMUNITY): Payer: 59

## 2015-07-29 DIAGNOSIS — R918 Other nonspecific abnormal finding of lung field: Secondary | ICD-10-CM

## 2015-07-29 DIAGNOSIS — I5021 Acute systolic (congestive) heart failure: Secondary | ICD-10-CM | POA: Diagnosis not present

## 2015-07-29 DIAGNOSIS — I5023 Acute on chronic systolic (congestive) heart failure: Secondary | ICD-10-CM | POA: Diagnosis not present

## 2015-07-29 DIAGNOSIS — I350 Nonrheumatic aortic (valve) stenosis: Secondary | ICD-10-CM | POA: Diagnosis not present

## 2015-07-29 LAB — BASIC METABOLIC PANEL
Anion gap: 9 (ref 5–15)
BUN: 17 mg/dL (ref 6–20)
CHLORIDE: 98 mmol/L — AB (ref 101–111)
CO2: 28 mmol/L (ref 22–32)
CREATININE: 1.01 mg/dL (ref 0.61–1.24)
Calcium: 8.1 mg/dL — ABNORMAL LOW (ref 8.9–10.3)
GFR calc non Af Amer: >60 mL/min (ref >60)
Glucose, Bld: 85 mg/dL (ref 65–99)
Potassium: 3.4 mmol/L — ABNORMAL LOW (ref 3.5–5.1)
Sodium: 135 mmol/L (ref 135–145)

## 2015-07-29 MED ORDER — SPIRONOLACTONE 25 MG PO TABS
25.0000 mg | ORAL_TABLET | Freq: Every day | ORAL | Status: DC
  Administered 2015-07-30 – 2015-08-01 (×2): 25 mg via ORAL
  Filled 2015-07-29 (×3): qty 1

## 2015-07-29 MED ORDER — POTASSIUM CHLORIDE CRYS ER 20 MEQ PO TBCR
40.0000 meq | EXTENDED_RELEASE_TABLET | Freq: Once | ORAL | Status: AC
  Administered 2015-07-29: 40 meq via ORAL

## 2015-07-29 NOTE — Progress Notes (Signed)
As per order, pt was ambulated up and back on3East hall totaling apprx 6160 ft without complication or assistance.

## 2015-07-29 NOTE — Progress Notes (Signed)
CARDIAC REHAB PHASE I   PRE:  Rate/Rhythm: 93 SR  BP:  Supine: 100/66  Sitting:   Standing:    SaO2: 98% 2L,  98% RA  MODE:  Ambulation: 420 ft   POST:  Rate/Rhythm: 99 SR  BP:  Supine:   Sitting: 94/60  Standing:    SaO2: 98%RA 1440-1530 Pt walked 420 ft on RA with hand held asst. Tolerated well. Denied SOB. Reviewed signs of CHF, daily weights, low sodium restriction of 2000 mg and fluid restriction.  Brother in room. Pt able to answer some teachback from previous ed.   Graylon Good, RN BSN  07/29/2015 3:27 PM

## 2015-07-29 NOTE — Progress Notes (Addendum)
Advanced Heart Failure Rounding Note   Subjective:     Admitted with volume overload. Diuresed with IV lasix. Yesterday lisinopril stopped and he was started on spiro and dig. Good diuresis.   Wants to get OOB. Denies SOB.    Objective:   Weight Range:  Vital Signs:   Temp:  [97.9 F (36.6 C)-98.2 F (36.8 C)] 97.9 F (36.6 C) (11/03 0459) Pulse Rate:  [92-98] 92 (11/03 1020) Resp:  [16-18] 16 (11/03 0459) BP: (87-102)/(59-67) 95/67 mmHg (11/03 1020) SpO2:  [95 %] 95 % (11/03 0459) Weight:  [161 lb 8 oz (73.256 kg)] 161 lb 8 oz (73.256 kg) (11/03 0459) Last BM Date: 07/27/15  Weight change: Filed Weights   07/27/15 0401 07/28/15 0413 07/29/15 0459  Weight: 171 lb 3.2 oz (77.656 kg) 165 lb 7 oz (75.043 kg) 161 lb 8 oz (73.256 kg)    Intake/Output:   Intake/Output Summary (Last 24 hours) at 07/29/15 1339 Last data filed at 07/29/15 1123  Gross per 24 hour  Intake   1255 ml  Output   2290 ml  Net  -1035 ml     Physical Exam: General:  Weak appearing No resp difficulty. In bed.  HEENT: normal x for poor dentition Neck: supple. JVP to jaw.  . Carotids 2+ bilat; no bruits. No lymphadenopathy or thryomegaly appreciated. Cor: PMI laterally displaced. Regular rate & rhythm. No rubs, gallops or murmurs. Lungs: clear with decreased BS throughout  Abdomen: soft, nontender, nondistended. No hepatosplenomegaly. No bruits or masses. Good bowel sounds. Extremities: no cyanosis, clubbing, rash, R and LLE 1-2+ edema Neuro: alert & orientedx3, cranial nerves grossly intact. moves all 4 extremities w/o difficulty. Affect pleasant  Telemetry: NSR 90s   Labs: Basic Metabolic Panel:  Recent Labs Lab 07/26/15 1410 07/26/15 2129 07/27/15 0318 07/28/15 0341 07/29/15 0419  NA 138  --  136 136 135  K 3.3*  --  4.2 3.5 3.4*  CL 103  --  102 99* 98*  CO2 23  --  24 27 28   GLUCOSE 100*  --  102* 93 85  BUN 16  --  13 13 17   CREATININE 1.27* 1.30* 1.15 0.96 1.01  CALCIUM  8.7*  --  8.4* 8.2* 8.1*  MG  --  1.1*  --   --   --     Liver Function Tests:  Recent Labs Lab 07/26/15 1410 07/27/15 0318 07/28/15 0341  AST 25 31 18   ALT 27 26 19   ALKPHOS 156* 142* 117  BILITOT 1.2 2.2* 2.1*  PROT 6.9 6.7 6.1*  ALBUMIN 2.9* 2.8* 2.4*   No results for input(s): LIPASE, AMYLASE in the last 168 hours. No results for input(s): AMMONIA in the last 168 hours.  CBC:  Recent Labs Lab 07/26/15 1410 07/26/15 2129 07/27/15 0318 07/28/15 0341  WBC 5.1 8.1 7.8 6.1  NEUTROABS 3.5  --   --   --   HGB 11.5* 11.5* 10.6* 10.2*  HCT 36.9* 37.2* 34.3* 33.3*  MCV 87.0 86.9 86.2 85.8  PLT 218 221 231 210    Cardiac Enzymes:  Recent Labs Lab 07/26/15 2129 07/27/15 0318 07/27/15 1033  TROPONINI 0.03 0.03 0.03    BNP: BNP (last 3 results)  Recent Labs  07/26/15 1410  BNP 1749.5*    ProBNP (last 3 results) No results for input(s): PROBNP in the last 8760 hours.    Other results:  Imaging:  No results found.   Medications:     Scheduled Medications: .  aspirin  81 mg Oral Daily  . atorvastatin  80 mg Oral q1800  . digoxin  0.25 mg Oral Daily  . furosemide  40 mg Intravenous Daily  . heparin  5,000 Units Subcutaneous 3 times per day  . omega-3 acid ethyl esters  1,000 mg Oral Daily  . potassium chloride  20 mEq Oral Daily  . sodium chloride  3 mL Intravenous Q12H  . sodium chloride  3 mL Intravenous Q12H  . spironolactone  12.5 mg Oral Daily     Infusions:     PRN Medications:  sodium chloride, sodium chloride, acetaminophen, acetaminophen, albuterol, nitroGLYCERIN, ondansetron (ZOFRAN) IV, ondansetron (ZOFRAN) IV, sodium chloride, sodium chloride   Assessment:  1. A/C Systolic/Diastolic  Heart Failure  2. CAD- Mid CX 100% stenosed R to L collaterals. Mid RCA 30%. Ost70% stenosed. Mild disease in the LAD. 3. Pulmonary Nodule- CT in 2013 5 mm pulmonary nodule 4. Hypokalemia 5. TSH elevated on admit free T3 2.1  6. Smoker     Plan/Discussion:    Volume status improved. Continue IV lasix 40 mg twice a day. Increase spiro 25 mg daily. Continue digoxin 0.25 mg daily. Hold bb for now. Add ted hose.   On statin, aspirin. Hold bb.   Non contrast CT chest to follow up on lung nodule noted 2013. Marland Kitchen   Length of Stay:    Darrick Grinder NP-C  07/29/2015, 1:39 PM  Advanced Heart Failure Team Pager [phone] (M-F; 7a - 4p)  Please contact [facility] for night-coverage after hours (4p -7a ) and weekends on [email or web address]   Patient seen and examined with Darrick Grinder, NP. We discussed all aspects of the encounter. I agree with the assessment and plan as stated above.   Volume status markedly improved but still a bit to go. BP is slightly better. Continue IV lasix. Will increase spiro to 25. Continu digoxin. No b-blocker yet due to low BP and low output. Walked 420 feet with PT. Agree with chest CT.   Aortic valve is calcified but I dont think AS is severe. Will likely need dobutamine echo.   Yissel Habermehl,MD 6:36 PM

## 2015-07-30 DIAGNOSIS — I350 Nonrheumatic aortic (valve) stenosis: Secondary | ICD-10-CM | POA: Diagnosis not present

## 2015-07-30 DIAGNOSIS — I5021 Acute systolic (congestive) heart failure: Secondary | ICD-10-CM | POA: Diagnosis not present

## 2015-07-30 LAB — BASIC METABOLIC PANEL
Anion gap: 11 (ref 5–15)
BUN: 16 mg/dL (ref 6–20)
CHLORIDE: 94 mmol/L — AB (ref 101–111)
CO2: 29 mmol/L (ref 22–32)
CREATININE: 1.07 mg/dL (ref 0.61–1.24)
Calcium: 8.2 mg/dL — ABNORMAL LOW (ref 8.9–10.3)
GFR calc non Af Amer: >60 mL/min (ref >60)
Glucose, Bld: 89 mg/dL (ref 65–99)
Potassium: 3.9 mmol/L (ref 3.5–5.1)
SODIUM: 134 mmol/L — AB (ref 135–145)

## 2015-07-30 LAB — MAGNESIUM: MAGNESIUM: 1 mg/dL — AB (ref 1.7–2.4)

## 2015-07-30 MED ORDER — MAGNESIUM SULFATE 4 GM/100ML IV SOLN
4.0000 g | Freq: Once | INTRAVENOUS | Status: AC
  Administered 2015-07-30: 4 g via INTRAVENOUS
  Filled 2015-07-30: qty 100

## 2015-07-30 MED ORDER — FUROSEMIDE 40 MG PO TABS
40.0000 mg | ORAL_TABLET | Freq: Every day | ORAL | Status: DC
  Administered 2015-08-01: 40 mg via ORAL
  Filled 2015-07-30 (×2): qty 1

## 2015-07-30 MED ORDER — POTASSIUM CHLORIDE CRYS ER 20 MEQ PO TBCR
20.0000 meq | EXTENDED_RELEASE_TABLET | Freq: Once | ORAL | Status: AC
  Administered 2015-07-30: 20 meq via ORAL
  Filled 2015-07-30: qty 1

## 2015-07-30 MED ORDER — MAGNESIUM SULFATE 2 GM/50ML IV SOLN
2.0000 g | Freq: Once | INTRAVENOUS | Status: AC
  Administered 2015-07-30: 2 g via INTRAVENOUS
  Filled 2015-07-30: qty 50

## 2015-07-30 MED ORDER — LOSARTAN POTASSIUM 25 MG PO TABS
12.5000 mg | ORAL_TABLET | Freq: Every day | ORAL | Status: DC
  Administered 2015-07-30 – 2015-08-01 (×2): 12.5 mg via ORAL
  Filled 2015-07-30 (×3): qty 1

## 2015-07-30 NOTE — Progress Notes (Signed)
CARDIAC REHAB PHASE I   PRE:  Rate/Rhythm: 93 SR  BP:  Supine: 94/62  Sitting:   Standing:    SaO2: 95%RA  MODE:  Ambulation: 700 ft   POST:  Rate/Rhythm: 101  BP:  Supine:   Sitting: 100/64  Standing:    SaO2: 92%RA 1327-1400 Pt walked 700 ft with steady gait. No SOB noted and did not need to rest. Gave pt ex ed and discussed CRP 2. Pt interested in referral to [facility]. Pt could answer teach back questions. To sitting on side of bed after walk. Pt stated has not smoked in a couple of months and does not plan on smoking anymore. Smoking cessation handout given,. Pt's A1C at 6.1. Encouraged him to watch sugars and carbs.   Graylon Good, RN BSN  07/30/2015 2:27 PM

## 2015-07-30 NOTE — Progress Notes (Addendum)
Advanced Heart Failure Rounding Note   Subjective:     Weight down 32 pounds. Feeling much better. No dyspnea. Walking with PT.    Objective:   Weight Range:  Vital Signs:   Temp:  [98.3 F (36.8 C)-98.9 F (37.2 C)] 98.3 F (36.8 C) (11/04 1208) Pulse Rate:  [92-98] 92 (11/04 1208) Resp:  [16-18] 18 (11/04 1208) BP: (93-113)/(59-75) 102/63 mmHg (11/04 1208) SpO2:  [94 %-100 %] 100 % (11/04 1208) Weight:  [71.26 kg (157 lb 1.6 oz)] 71.26 kg (157 lb 1.6 oz) (11/04 0519) Last BM Date: 07/28/15  Weight change: Filed Weights   07/28/15 0413 07/29/15 0459 07/30/15 0519  Weight: 75.043 kg (165 lb 7 oz) 73.256 kg (161 lb 8 oz) 71.26 kg (157 lb 1.6 oz)    Intake/Output:   Intake/Output Summary (Last 24 hours) at 07/30/15 1618 Last data filed at 07/30/15 1406  Gross per 24 hour  Intake   1010 ml  Output   1875 ml  Net   -865 ml     Physical Exam: General:  Weak appearing No resp difficulty. In bed.  HEENT: normal x for poor dentition Neck: supple. JVP 6  . Carotids 2+ bilat; no bruits. No lymphadenopathy or thryomegaly appreciated. Cor: PMI laterally displaced. Regular rate & rhythm. No rubs, gallops or murmurs. Lungs: clear with decreased BS throughout  Abdomen: soft, nontender, nondistended. No hepatosplenomegaly. No bruits or masses. Good bowel sounds. Extremities: no cyanosis, clubbing, rash, R and LLE no edema Neuro: alert & orientedx3, cranial nerves grossly intact. moves all 4 extremities w/o difficulty. Affect pleasant  Telemetry: NSR 90s   Labs: Basic Metabolic Panel:  Recent Labs Lab 07/26/15 1410 07/26/15 2129 07/27/15 0318 07/28/15 0341 07/29/15 0419 07/30/15 0259 07/30/15 1147  NA 138  --  136 136 135 134*  --   K 3.3*  --  4.2 3.5 3.4* 3.9  --   CL 103  --  102 99* 98* 94*  --   CO2 23  --  24 27 28 29   --   GLUCOSE 100*  --  102* 93 85 89  --   BUN 16  --  13 13 17 16   --   CREATININE 1.27* 1.30* 1.15 0.96 1.01 1.07  --   CALCIUM 8.7*   --  8.4* 8.2* 8.1* 8.2*  --   MG  --  1.1*  --   --   --   --  1.0*    Liver Function Tests:  Recent Labs Lab 07/26/15 1410 07/27/15 0318 07/28/15 0341  AST 25 31 18   ALT 27 26 19   ALKPHOS 156* 142* 117  BILITOT 1.2 2.2* 2.1*  PROT 6.9 6.7 6.1*  ALBUMIN 2.9* 2.8* 2.4*   No results for input(s): LIPASE, AMYLASE in the last 168 hours. No results for input(s): AMMONIA in the last 168 hours.  CBC:  Recent Labs Lab 07/26/15 1410 07/26/15 2129 07/27/15 0318 07/28/15 0341  WBC 5.1 8.1 7.8 6.1  NEUTROABS 3.5  --   --   --   HGB 11.5* 11.5* 10.6* 10.2*  HCT 36.9* 37.2* 34.3* 33.3*  MCV 87.0 86.9 86.2 85.8  PLT 218 221 231 210    Cardiac Enzymes:  Recent Labs Lab 07/26/15 2129 07/27/15 0318 07/27/15 1033  TROPONINI 0.03 0.03 0.03    BNP: BNP (last 3 results)  Recent Labs  07/26/15 1410  BNP 1749.5*    ProBNP (last 3 results) No results for input(s): PROBNP in  the last 8760 hours.    Other results:  Imaging: Ct Chest Wo Contrast  07/29/2015  CLINICAL DATA:  64 year old with current history of acute on chronic systolic congestive heart failure, aortic stenosis and a personal history of testicular cancer, presenting with shortness of breath. A lung nodule identified on a prior CT chest is being followed. EXAM: CT CHEST WITHOUT CONTRAST TECHNIQUE: Multidetector CT imaging of the chest was performed following the standard protocol without IV contrast. 1 mm thin-section soft tissue window images were reconstructed. COMPARISON:  CTA chest 05/05/2012.  Multiple prior chest x-rays. FINDINGS: Lack of intravenous contrast makes the examination less sensitive than it would be otherwise. Mediastinum/Lymph Nodes: No pathologic mediastinal, hilar or axillary lymph nodes. No mediastinal masses. Normal-appearing esophagus. Cardiovascular: Heart moderately enlarged. Three vessel coronary atherosclerosis. Severe aortic valvular calcification. No pericardial effusion. Moderate  atherosclerosis involving the thoracic and upper abdominal aorta without aneurysm. Lungs/Pleura: The previously identified subpleural nodule in the left lower lobe laterally is less well seen today due to the fact that the patient had a less optimal inspiration and the nodule is now at the level of the left hemidiaphragm. The nodule measures approximately 8 mm, not significantly changed from the prior examination greater than 3 years ago. A 2nd nodule that was previously identified was situated between segmental branches of the left lower lobe bronchus and between branches of the left lower lobe pulmonary artery and vein. The nodule is difficult to visualize on the current unenhanced images, but the distance between the segmental bronchi approximates 7 mm, not significantly changed since the prior CT. Due to its interbronchial position, this likely represents an intersegmental lymph node no new or enlarging nodules in either lung. Biapical pleuroparenchymal scarring and emphysematous changes as noted previously. Large right pleural effusion and moderate-sized left pleural effusion with associated passive atelectasis in the lower lobes, right greater than left. Fluid in the major fissure on the left. Mild diffuse interstitial pulmonary edema. Upper abdomen: Surgical clips in the retroperitoneum of the right upper abdomen. Stable mild pancreatic atrophy. No acute findings in the visualized upper abdomen. Musculoskeletal: Degenerative disc disease and spondylosis diffusely throughout the thoracic spine. Chronic mild anterior wedge deformities of T5, T6, T7 and T8 with multiple Schmorl's nodes indicating Scheuermann's disease. IMPRESSION: 1. No significant change in the subpleural nodule in the lateral left lower lobe since the prior examination greater than 3 years ago, indicating benignity. 2. COPD/emphysema. Mild CHF with mild diffuse interstitial pulmonary edema. 3. Large right pleural effusion and moderate-sized  left pleural effusion with associated passive atelectasis in the lower lobes. Fluid is present in the left major fissure. 4. Moderate cardiomegaly. Severe aortic valvular calcification. Three vessel coronary atherosclerosis. Electronically Signed   By: Evangeline Dakin M.D.   On: 07/29/2015 17:34     Medications:     Scheduled Medications: . aspirin  81 mg Oral Daily  . atorvastatin  80 mg Oral q1800  . digoxin  0.25 mg Oral Daily  . [START ON 07/31/2015] furosemide  40 mg Oral Daily  . heparin  5,000 Units Subcutaneous 3 times per day  . losartan  12.5 mg Oral Daily  . magnesium sulfate 1 - 4 g bolus IVPB  2 g Intravenous Once  . magnesium sulfate 1 - 4 g bolus IVPB  4 g Intravenous Once  . omega-3 acid ethyl esters  1,000 mg Oral Daily  . potassium chloride  20 mEq Oral Daily  . potassium chloride  20 mEq Oral Once  .  sodium chloride  3 mL Intravenous Q12H  . sodium chloride  3 mL Intravenous Q12H  . spironolactone  25 mg Oral Daily    Infusions:    PRN Medications: sodium chloride, sodium chloride, acetaminophen, albuterol, nitroGLYCERIN, ondansetron (ZOFRAN) IV, sodium chloride, sodium chloride   Assessment:  1. A/C Systolic/Diastolic  Heart Failure  2. CAD- Mid CX 100% stenosed R to L collaterals. Mid RCA 30%. Ost70% stenosed. Mild disease in the LAD. 3. Pulmonary Nodule- CT in 2013 5 mm pulmonary nodule 4. Hypokalemia 5. TSH elevated on admit free T3 2.1  6. Smoker    Plan/Discussion:    Volume status markedly improved. Switch to po lasix 40 daily. Continue spiro and dig. Add low-dose losartan 12.5 qhs. No b-blocker yet.  Supp K and Mag.   Chest CT with stable nodules.   Aortic valve is calcified but I dont think AS is severe. Will likely need dobutamine echo as outpatient.   Will shoot for d/c Sunday or Monday.    Ambera Fedele,MD 4:21 PM   Advanced Heart Failure Team Pager [phone] (M-F; 7a - 4p)  Please contact [facility] for night-coverage  after hours (4p -7a ) and weekends on [email or web address]

## 2015-07-31 DIAGNOSIS — I5023 Acute on chronic systolic (congestive) heart failure: Secondary | ICD-10-CM | POA: Diagnosis not present

## 2015-07-31 DIAGNOSIS — I350 Nonrheumatic aortic (valve) stenosis: Secondary | ICD-10-CM | POA: Diagnosis not present

## 2015-07-31 LAB — BASIC METABOLIC PANEL
Anion gap: 10 (ref 5–15)
BUN: 15 mg/dL (ref 6–20)
CALCIUM: 8.4 mg/dL — AB (ref 8.9–10.3)
CO2: 27 mmol/L (ref 22–32)
CREATININE: 0.96 mg/dL (ref 0.61–1.24)
Chloride: 94 mmol/L — ABNORMAL LOW (ref 101–111)
GFR calc Af Amer: >60 mL/min (ref >60)
GFR calc non Af Amer: >60 mL/min (ref >60)
GLUCOSE: 92 mg/dL (ref 65–99)
Potassium: 4.3 mmol/L (ref 3.5–5.1)
Sodium: 131 mmol/L — ABNORMAL LOW (ref 135–145)

## 2015-07-31 LAB — MAGNESIUM: Magnesium: 2.1 mg/dL (ref 1.7–2.4)

## 2015-07-31 NOTE — Progress Notes (Signed)
Pt's BP was low; 88/55. MD notified. MD advised to hold BP meds :Losartan, Lasix, Spironolactone. He advised to change the BP med's parameters to holding it if the SBP is less than 90. He advised to give digoxin as scheduled.

## 2015-07-31 NOTE — Progress Notes (Signed)
Patient ID: Jordan Johnston, male   DOB: [birth year], 64 y.o.   MRN: [id number]    Subjective:    Doing well no dyspnea Sitting in chair   Objective:   Weight Range:  Vital Signs:   Temp:  [98.1 F (36.7 C)-98.9 F (37.2 C)] 98.1 F (36.7 C) (11/05 0857) Pulse Rate:  [84-93] 84 (11/05 0857) Resp:  [17-18] 18 (11/05 0857) BP: (87-111)/(53-77) 88/55 mmHg (11/05 0857) SpO2:  [95 %-100 %] 97 % (11/05 0857) Weight:  [68.765 kg (151 lb 9.6 oz)] 68.765 kg (151 lb 9.6 oz) (11/05 0532) Last BM Date: 07/30/15  Weight change: Filed Weights   07/29/15 0459 07/30/15 0519 07/31/15 0532  Weight: 73.256 kg (161 lb 8 oz) 71.26 kg (157 lb 1.6 oz) 68.765 kg (151 lb 9.6 oz)    Intake/Output:   Intake/Output Summary (Last 24 hours) at 07/31/15 0949 Last data filed at 07/31/15 0710  Gross per 24 hour  Intake    760 ml  Output   2250 ml  Net  -1490 ml     Physical Exam: General:  Weak appearing No resp difficulty. In bed.  HEENT: normal x for poor dentition Neck: supple. JVP 6  . Carotids 2+ bilat; no bruits. No lymphadenopathy or thryomegaly appreciated. Cor: PMI laterally displaced. Regular rate & rhythm. No rubs, gallops or murmurs. Lungs: clear with decreased BS throughout  Abdomen: soft, nontender, nondistended. No hepatosplenomegaly. No bruits or masses. Good bowel sounds. Extremities: no cyanosis, clubbing, rash, R and LLE no edema Neuro: alert & orientedx3, cranial nerves grossly intact. moves all 4 extremities w/o difficulty. Affect pleasant  Telemetry: NSR 90s   Labs: Basic Metabolic Panel:  Recent Labs Lab 07/26/15 2129 07/27/15 0318 07/28/15 0341 07/29/15 0419 07/30/15 0259 07/30/15 1147 07/31/15 0416  NA  --  136 136 135 134*  --  131*  K  --  4.2 3.5 3.4* 3.9  --  4.3  CL  --  102 99* 98* 94*  --  94*  CO2  --  24 27 28 29   --  27  GLUCOSE  --  102* 93 85 89  --  92  BUN  --  13 13 17 16   --  15  CREATININE 1.30* 1.15 0.96 1.01 1.07  --  0.96  CALCIUM  --   8.4* 8.2* 8.1* 8.2*  --  8.4*  MG 1.1*  --   --   --   --  1.0* 2.1    Liver Function Tests:  Recent Labs Lab 07/26/15 1410 07/27/15 0318 07/28/15 0341  AST 25 31 18   ALT 27 26 19   ALKPHOS 156* 142* 117  BILITOT 1.2 2.2* 2.1*  PROT 6.9 6.7 6.1*  ALBUMIN 2.9* 2.8* 2.4*   No results for input(s): LIPASE, AMYLASE in the last 168 hours. No results for input(s): AMMONIA in the last 168 hours.  CBC:  Recent Labs Lab 07/26/15 1410 07/26/15 2129 07/27/15 0318 07/28/15 0341  WBC 5.1 8.1 7.8 6.1  NEUTROABS 3.5  --   --   --   HGB 11.5* 11.5* 10.6* 10.2*  HCT 36.9* 37.2* 34.3* 33.3*  MCV 87.0 86.9 86.2 85.8  PLT 218 221 231 210    Cardiac Enzymes:  Recent Labs Lab 07/26/15 2129 07/27/15 0318 07/27/15 1033  TROPONINI 0.03 0.03 0.03    BNP: BNP (last 3 results)  Recent Labs  07/26/15 1410  BNP 1749.5*      Other results:  Imaging: Ct Chest Wo  Contrast  07/29/2015  CLINICAL DATA:  64 year old with current history of acute on chronic systolic congestive heart failure, aortic stenosis and a personal history of testicular cancer, presenting with shortness of breath. A lung nodule identified on a prior CT chest is being followed. EXAM: CT CHEST WITHOUT CONTRAST TECHNIQUE: Multidetector CT imaging of the chest was performed following the standard protocol without IV contrast. 1 mm thin-section soft tissue window images were reconstructed. COMPARISON:  CTA chest 05/05/2012.  Multiple prior chest x-rays. FINDINGS: Lack of intravenous contrast makes the examination less sensitive than it would be otherwise. Mediastinum/Lymph Nodes: No pathologic mediastinal, hilar or axillary lymph nodes. No mediastinal masses. Normal-appearing esophagus. Cardiovascular: Heart moderately enlarged. Three vessel coronary atherosclerosis. Severe aortic valvular calcification. No pericardial effusion. Moderate atherosclerosis involving the thoracic and upper abdominal aorta without aneurysm.  Lungs/Pleura: The previously identified subpleural nodule in the left lower lobe laterally is less well seen today due to the fact that the patient had a less optimal inspiration and the nodule is now at the level of the left hemidiaphragm. The nodule measures approximately 8 mm, not significantly changed from the prior examination greater than 3 years ago. A 2nd nodule that was previously identified was situated between segmental branches of the left lower lobe bronchus and between branches of the left lower lobe pulmonary artery and vein. The nodule is difficult to visualize on the current unenhanced images, but the distance between the segmental bronchi approximates 7 mm, not significantly changed since the prior CT. Due to its interbronchial position, this likely represents an intersegmental lymph node no new or enlarging nodules in either lung. Biapical pleuroparenchymal scarring and emphysematous changes as noted previously. Large right pleural effusion and moderate-sized left pleural effusion with associated passive atelectasis in the lower lobes, right greater than left. Fluid in the major fissure on the left. Mild diffuse interstitial pulmonary edema. Upper abdomen: Surgical clips in the retroperitoneum of the right upper abdomen. Stable mild pancreatic atrophy. No acute findings in the visualized upper abdomen. Musculoskeletal: Degenerative disc disease and spondylosis diffusely throughout the thoracic spine. Chronic mild anterior wedge deformities of T5, T6, T7 and T8 with multiple Schmorl's nodes indicating Scheuermann's disease. IMPRESSION: 1. No significant change in the subpleural nodule in the lateral left lower lobe since the prior examination greater than 3 years ago, indicating benignity. 2. COPD/emphysema. Mild CHF with mild diffuse interstitial pulmonary edema. 3. Large right pleural effusion and moderate-sized left pleural effusion with associated passive atelectasis in the lower lobes. Fluid  is present in the left major fissure. 4. Moderate cardiomegaly. Severe aortic valvular calcification. Three vessel coronary atherosclerosis. Electronically Signed   By: Evangeline Dakin M.D.   On: 07/29/2015 17:34     Medications:     Scheduled Medications: . aspirin  81 mg Oral Daily  . atorvastatin  80 mg Oral q1800  . digoxin  0.25 mg Oral Daily  . furosemide  40 mg Oral Daily  . heparin  5,000 Units Subcutaneous 3 times per day  . losartan  12.5 mg Oral Daily  . omega-3 acid ethyl esters  1,000 mg Oral Daily  . potassium chloride  20 mEq Oral Daily  . sodium chloride  3 mL Intravenous Q12H  . sodium chloride  3 mL Intravenous Q12H  . spironolactone  25 mg Oral Daily    Infusions:    PRN Medications: sodium chloride, sodium chloride, acetaminophen, albuterol, nitroGLYCERIN, ondansetron (ZOFRAN) IV, sodium chloride, sodium chloride   Assessment:  1. A/C Systolic/Diastolic  Heart Failure  2. CAD- Mid CX 100% stenosed R to L collaterals. Mid RCA 30%. Ost70% stenosed. Mild disease in the LAD. 3. Pulmonary Nodule- CT in 2013 5 mm pulmonary nodule 4. Hypokalemia 5. TSH elevated on admit free T3 2.1  6. Smoker    Plan/Discussion:    Volume status markedly improved. On oral lasix can start beta blocker as outpatient  K now normal   Chest CT with stable nodules.   AS probably moderate consider dobutamine echo as outpatient before beta blocker started   D/C in am with close CHF clinic f/u    Verlin Dike 9:49 AM

## 2015-08-01 DIAGNOSIS — I5023 Acute on chronic systolic (congestive) heart failure: Secondary | ICD-10-CM | POA: Diagnosis not present

## 2015-08-01 LAB — BASIC METABOLIC PANEL
ANION GAP: 9 (ref 5–15)
BUN: 18 mg/dL (ref 6–20)
CHLORIDE: 95 mmol/L — AB (ref 101–111)
CO2: 27 mmol/L (ref 22–32)
Calcium: 8.5 mg/dL — ABNORMAL LOW (ref 8.9–10.3)
Creatinine, Ser: 1.07 mg/dL (ref 0.61–1.24)
GFR calc Af Amer: >60 mL/min (ref >60)
Glucose, Bld: 95 mg/dL (ref 65–99)
POTASSIUM: 4.6 mmol/L (ref 3.5–5.1)
SODIUM: 131 mmol/L — AB (ref 135–145)

## 2015-08-01 MED ORDER — SPIRONOLACTONE 25 MG PO TABS: 12.5000 mg | ORAL_TABLET | Freq: Every day | ORAL | Status: DC

## 2015-08-01 MED ORDER — FUROSEMIDE 20 MG PO TABS: 20.0000 mg | ORAL_TABLET | Freq: Every day | ORAL | Status: DC

## 2015-08-01 MED ORDER — LOSARTAN POTASSIUM 25 MG PO TABS: 12.5000 mg | ORAL_TABLET | Freq: Every day | ORAL | Status: DC

## 2015-08-01 MED ORDER — ATORVASTATIN CALCIUM 80 MG PO TABS: 80.0000 mg | ORAL_TABLET | Freq: Every day | ORAL | Status: DC

## 2015-08-01 MED ORDER — DIGOXIN 250 MCG PO TABS: 0.2500 mg | ORAL_TABLET | Freq: Every day | ORAL | Status: DC

## 2015-08-01 NOTE — Progress Notes (Addendum)
Subjective: No SOB or orthopnea. Lasix, losartan and spiro held yesterday for BP in 80s. Asymptomatic. Weight still down. Has lost 25 pounds total. Renal function stable.   Objective: Vital signs in last 24 hours: Temp:  [97.3 F (36.3 C)-98.3 F (36.8 C)] 98.2 F (36.8 C) (11/06 0430) Pulse Rate:  [81-91] 81 (11/06 0430) Resp:  [16-20] 20 (11/06 0430) BP: (87-94)/(54-58) 92/58 mmHg (11/06 0430) SpO2:  [93 %-97 %] 97 % (11/06 0430) Weight:  [148 lb 14.4 oz (67.541 kg)] 148 lb 14.4 oz (67.541 kg) (11/06 0430) Last BM Date: 07/30/15  Intake/Output from previous day: 11/05 0701 - 11/06 0700 In: 256 [P.O.:256] Out: 1025 [Urine:1025] Intake/Output this shift:    Medications Scheduled Meds: . aspirin  81 mg Oral Daily  . atorvastatin  80 mg Oral q1800  . digoxin  0.25 mg Oral Daily  . furosemide  40 mg Oral Daily  . heparin  5,000 Units Subcutaneous 3 times per day  . losartan  12.5 mg Oral Daily  . omega-3 acid ethyl esters  1,000 mg Oral Daily  . potassium chloride  20 mEq Oral Daily  . sodium chloride  3 mL Intravenous Q12H  . sodium chloride  3 mL Intravenous Q12H  . spironolactone  25 mg Oral Daily   Continuous Infusions:  PRN Meds:.sodium chloride, sodium chloride, acetaminophen, albuterol, nitroGLYCERIN, ondansetron (ZOFRAN) IV, sodium chloride, sodium chloride  PE: General appearance: alert, cooperative and no distress Lungs: clear to auscultation bilaterally Heart: regular rate and rhythm and 2/6 sys MM  Abdomen: soft, non-tender; bowel sounds normal; no masses,  no organomegaly Extremities: No LEE Pulses: 1+ right, 2+ left radial, 1+ dps Skin: Warm and dry Neurologic: Grossly normal  Lab Results:  No results for input(s): WBC, HGB, HCT, PLT in the last 72 hours. BMET  Recent Labs  07/30/15 0259 07/31/15 0416 08/01/15 0149  NA 134* 131* 131*  K 3.9 4.3 4.6  CL 94* 94* 95*  CO2 29 27 27   GLUCOSE 89 92 95  BUN 16 15 18   CREATININE 1.07 0.96 1.07   CALCIUM 8.2* 8.4* 8.5*   Lipid Panel     Component Value Date/Time   CHOL 99 07/27/2015 0318   TRIG 60 07/27/2015 0318   HDL 25* 07/27/2015 0318   CHOLHDL 4.0 07/27/2015 0318   VLDL 12 07/27/2015 0318   LDLCALC 62 07/27/2015 0318     Assessment/Plan     Acute on chronic systolic congestive heart failure (HCC) Lasix, cozaar and aldactone held yesterday due to hypotension.  Net fluids: -0.8L/-8.0L.  PO lasix 40 daily, aldactone 25, digoxin, cozaar 12.5.  EF 10-15%.  Low sodium diet and daily eight monitoring discussed.    Hypotension BP 92/58 this morning.  The patient appears asymptomatic.  Recommend decreasing aldactone to 12.5.  I think we should DC the cozaar and reassess in the office.     Aortic stenosis: mild on echo   CAD- Mid CX 100% stenosed R to L collaterals. Mid RCA 30%. Ost70% stenosed. Mild disease in the LAD.   Pulmonary Nodule- CT in 2013 5 mm pulmonary nodule:  Stable   Hypokalemia: resolved   TSH elevated on admit free T3 2.1    Tobacco abuse: quit two months ago   Hyperlipidemia  Lipitor 80-new med.   Will need LFTs and lipids in 6 weeks    HAGER, BRYAN PA-C 08/01/2015 7:10 AM    Patient seen and examined with Tarri Fuller, PA-C. We discussed all  aspects of the encounter. I agree with the assessment and plan as stated above.   Can go home today. He will live with his brother for a while.   Would send out on:  Lasix 20 daily Spiro 12.5 daily Dig 0.25 daily Losartan 12.5 daily Asa 81 daily Atorva 80 daily  F/u HF clinic within 1 week to reassess. Will likely need dobutamine echo at some point to reassess AoV stenosis.  Bensimhon, Daniel,MD 8:48 AM

## 2015-08-01 NOTE — Progress Notes (Signed)
Pt discharged home, Discharge instructions given to [facility] and patient, verbalized understanding. Questions answered. HF education given.IV and tele removed. Wheeled out via wheel chair.

## 2015-08-01 NOTE — Discharge Summary (Signed)
Physician Discharge Summary   Cardiologist:  Berry/Riley Hallum  Patient ID: Jordan Johnston MRN: [id number] DOB/AGE: 1950/11/20 64 y.o.  Admit date: 07/26/2015 Discharge date: 08/01/2015  Admission Diagnoses: Acute on chronic systolic congestive heart failure [facility])  Discharge Diagnoses:    Acute on chronic systolic congestive heart failure (HCC)   Hypotension  Aortic stenosis  CAD- Mid CX 100% stenosed R to L collaterals. Mid RCA 30%. Ost70% stenosed. Mild disease in the LAD.  Pulmonary Nodule- CT in 2013 5 mm pulmonary nodule  Hypokalemia:  TSH elevated  Tobacco abuse  Hyperlipidemia  Discharged Condition: stable  Hospital Course:   Jordan Johnston is a 64 y.o. male with a history of testicular cancer, HTN, HLD, carotid stenosis s/p R CEA (2009), tobacco abuse, LV dysfunction (EF 40-45% in 04/2015), CAD with known 100% LCx occlusion, and mild AS/ mild MR who presented to [facility] today with SOB and LE edema.   Per old notes ( 2009) he does have a significant coronary history. He has a known 100% proximal circumflex occlusion and a 30% proximal LAD lesion, as well as a 90% ostial diagonal lesion. EF 40%. Not sure when the cardiac catheterization was done. He was admitted for pre syncope in 04/2012. He had a 2D ECHO which revealed 40-45%, mild concentric hypertrophy and probable moderate hypokinesis of the entireinferior and inferoseptal myocardium, G2DD, mild AS, mild MR, and mod LA dilation. The patient left AMA before this was resulted. He was also noted to have a pulmonary nodule in the LLL on CT and follow up was recommended in 6-12 months. He never followed up on this.   He was seen by an urgent care MD ~ 1 month ago and diagnosed with PNA and placed on Abx. Per patient about 2 weeks ago he noted worsening LE edema, SOB and orthopnea. Patient has worsening shortness of breath with exertion as well as at night. He denies chest pain. He does not follow with a PCP and only sees the  urgent care dr when needed.   Was admitted and started on IV diuretic as well as ACE inhibitor, aspirin, statin. He underwent 2-D echocardiogram which revealed a severe severely depressed ejection fraction of 10-15% with diffuse hypokinesis. Mild aortic stenosis with a mean valve area of 1.29 cm. Mild MR. Left atrium is moderate to severely dilated. Right ventricle has moderately increased wall thickness and systolic function was moderately reduced. Right atrium moderately dilated. There was moderate tricuspid regurgitation. Peak PA pressure 48 mmHg.  He also underwent a left heart catheterization revealing mid Cx lesion, 100% stenosed. Right to left collaterals fill a large OM2. Mid RCA to Dist RCA lesion, 30% stenosed. Ost 2nd Diag lesion, 70% stenosed. Mild disease in the LAD. There is severe left ventricular systolic dysfunction. Mild aortic valve gradient.  Follow-up of his pulmonary nodule on CT showed no significant change in the subpleural nodule in the lateral left lower lobe since the prior examination greater than 3 years ago, indicating benignity.  Overall had excellent diuresis with -8 L. He was started on digoxin, spironolactone 25 and Cozaar.  He's been consistently hypotensive and did not get the Cozaar or PO Lasix the last day.   Lasix was decreased to 20mg  daily, spironolactone to 12.5mg .   He was not started on a beta blocker due to this reason and because of low output. Patient did well with cardiac rehabilitation walking 700 feet on room air with no complaints. Daily weight monitoring and low-sodium diet was discussed.  Potassium improved.   He has low gradient AS, but it is not thought to be severe or a cause for the cardiomyopathy. Consider dobutamine echo as outpatient.  The patient was seen by Dr. Haroldine Laws who felt he was stable for DC home.    Consults: Advanced CHF  Significant Diagnostic Studies:  Left heart cath Conclusion     Mid Cx lesion, 100% stenosed. Right to left  collaterals fill a large OM2.  Mid RCA to Dist RCA lesion, 30% stenosed.  Ost 2nd Diag lesion, 70% stenosed. Mild disease in the LAD.  There is severe left ventricular systolic dysfunction.  Mild aortic valve gradient.  Known occlusion of the circumflex and moderate diagonal disease, from prior catheterization done years ago. Significant decrease in left ventricular systolic function is not related to any new, significant coronary artery disease. Nonobstructive disease noted in the LAD and RCA. Continue medical therapy.   Diagnostic Diagram         Echocardiogram 07/27/15 Study Conclusions  - Left ventricle: The cavity size was mildly dilated. The estimated ejection fraction was in the range of 10% to 15%. Severe diffuse hypokinesis with distinct regional wall motion abnormalities. Akinesis of the basal-midinferolateral, inferior, and inferoseptal myocardium. - Aortic valve: Transvalvular velocity was increased less than expected, due to low cardiac output. There was mild stenosis. Valve area (VTI): 1.37 cm^2. Valve area (Vmax): 1.35 cm^2. Valve area (Vmean): 1.29 cm^2. - Mitral valve: Calcified annulus. Mildly thickened leaflets . There was mild regurgitation. Valve area by continuity equation (using LVOT flow): 2.88 cm^2. - Left atrium: The atrium was moderately to severely dilated. - Right ventricle: Wall thickness was moderately increased. Systolic function was moderately reduced. - Right atrium: The atrium was moderately dilated. - Tricuspid valve: There was moderate regurgitation. - Pulmonary arteries: Systolic pressure was moderately increased. PA peak pressure: 48 mm Hg (S). - Pericardium, extracardiac: There was a left pleural effusion.  Impressions:  - Compared to 2013 study, there is interval severe deterioration in LV systolic and diastolic function.  07/29/15, CT CHEST WITHOUT CONTRAST  TECHNIQUE: Multidetector CT imaging of the  chest was performed following the standard protocol without IV contrast. 1 mm thin-section soft tissue window images were reconstructed.  COMPARISON: CTA chest 05/05/2012. Multiple prior chest x-rays.  FINDINGS: Lack of intravenous contrast makes the examination less sensitive than it would be otherwise.  Mediastinum/Lymph Nodes: No pathologic mediastinal, hilar or axillary lymph nodes. No mediastinal masses. Normal-appearing esophagus.  Cardiovascular: Heart moderately enlarged. Three vessel coronary atherosclerosis. Severe aortic valvular calcification. No pericardial effusion. Moderate atherosclerosis involving the thoracic and upper abdominal aorta without aneurysm.  Lungs/Pleura: The previously identified subpleural nodule in the left lower lobe laterally is less well seen today due to the fact that the patient had a less optimal inspiration and the nodule is now at the level of the left hemidiaphragm. The nodule measures approximately 8 mm, not significantly changed from the prior examination greater than 3 years ago.  A 2nd nodule that was previously identified was situated between segmental branches of the left lower lobe bronchus and between branches of the left lower lobe pulmonary artery and vein. The nodule is difficult to visualize on the current unenhanced images, but the distance between the segmental bronchi approximates 7 mm, not significantly changed since the prior CT. Due to its interbronchial position, this likely represents an intersegmental lymph node no new or enlarging nodules in either lung.  Biapical pleuroparenchymal scarring and emphysematous changes as noted previously. Large right  pleural effusion and moderate-sized left pleural effusion with associated passive atelectasis in the lower lobes, right greater than left. Fluid in the major fissure on the left. Mild diffuse interstitial pulmonary edema.  Upper abdomen: Surgical clips in the  retroperitoneum of the right upper abdomen. Stable mild pancreatic atrophy. No acute findings in the visualized upper abdomen.  Musculoskeletal: Degenerative disc disease and spondylosis diffusely throughout the thoracic spine. Chronic mild anterior wedge deformities of T5, T6, T7 and T8 with multiple Schmorl's nodes indicating Scheuermann's disease.  IMPRESSION: 1. No significant change in the subpleural nodule in the lateral left lower lobe since the prior examination greater than 3 years ago, indicating benignity. 2. COPD/emphysema. Mild CHF with mild diffuse interstitial pulmonary edema. 3. Large right pleural effusion and moderate-sized left pleural effusion with associated passive atelectasis in the lower lobes. Fluid is present in the left major fissure. 4. Moderate cardiomegaly. Severe aortic valvular calcification. Three vessel coronary atherosclerosis.   Treatments: See above  Discharge Exam: Blood pressure 92/62, pulse 83, temperature 97.9 F (36.6 C), temperature source Oral, resp. rate 18, height 5\' 10"  (1.778 m), weight 148 lb 14.4 oz (67.541 kg), SpO2 98 %.   Disposition: 01-Home or Self Care      Discharge Instructions    Amb Referral to Cardiac Rehabilitation    Complete by:  As directed   Diagnosis:  Heart Failure (see criteria below)     Diet - low sodium heart healthy    Complete by:  As directed      Discharge instructions    Complete by:  As directed   Monitor your weight every morning.  If you gain 3 pounds in 24 hours, or 5 pounds in a week, call the office for instructions.     Increase activity slowly    Complete by:  As directed             Medication List    STOP taking these medications        NIACIN PO      TAKE these medications        albuterol 108 (90 BASE) MCG/ACT inhaler  Commonly known as:  PROVENTIL HFA;VENTOLIN HFA  Inhale 2 puffs into the lungs every 6 (six) hours as needed for wheezing or shortness of breath.      aspirin 81 MG tablet  Take 81 mg by mouth daily.     atorvastatin 80 MG tablet  Commonly known as:  LIPITOR  Take 1 tablet (80 mg total) by mouth daily at 6 PM.     digoxin 0.25 MG tablet  Commonly known as:  LANOXIN  Take 1 tablet (0.25 mg total) by mouth daily.     Fish Oil 1000 MG Caps  Take 1,000 mg by mouth daily.     furosemide 20 MG tablet  Commonly known as:  LASIX  Take 1 tablet (20 mg total) by mouth daily.     losartan 25 MG tablet  Commonly known as:  COZAAR  Take 0.5 tablets (12.5 mg total) by mouth daily.     spironolactone 25 MG tablet  Commonly known as:  ALDACTONE  Take 0.5 tablets (12.5 mg total) by mouth daily.       Follow-up Information    Follow up with Glori Bickers, MD.   Specialty:  Cardiology   Why:  The office will call you with the appointment date and time   Contact information:   [address] [place] [address]  [phone]      Greater than 30 minutes was spent completing the patient's discharge.    SignedTarri Fuller, [facility] 08/01/2015, 9:09 AM  Patient seen and examined with Tarri Fuller, PA-C. We discussed all aspects of the encounter. I agree with the assessment and plan as stated above.   He has advanced HF and remains very tenuous. Will need close f/u in HF Clinic. Likely not good candidate for advanced therapies.   Aijalon Kirtz,MD 9:29 PM

## 2015-08-02 ENCOUNTER — Telehealth (HOSPITAL_COMMUNITY): Payer: Self-pay | Admitting: Surgery

## 2015-08-02 NOTE — Telephone Encounter (Signed)
Heart Failure Nurse Navigator Post Discharge Telephone Call  I called Mr. Sagrero at several numbers and was finally able to reach him at his brother's number.  His brother tells me that he weighed this am and is 3 lbs down from "baseline weight" yesterday on home scales.  He was able to fill all medications and is taking those with no problems so far.  I did make a follow-up appt for Jordan Johnston for November 15th at 1140 am in the [facility] as well as provide directions and the code for entry into the parking garage.  His brother says that they will be there.  I encouraged him to purchase a BP cuff as his BP ran quite low in the hospital.  I also encouraged them to call me back with any concerns or questions related to HF.

## 2015-08-10 ENCOUNTER — Ambulatory Visit (HOSPITAL_COMMUNITY)
Admission: RE | Admit: 2015-08-10 | Discharge: 2015-08-10 | Disposition: A | Discharge status: Home / Self Care | Payer: 59 | Source: Ambulatory Visit | Attending: Internal Medicine | Admitting: Internal Medicine

## 2015-08-10 VITALS — BP 100/62 | HR 82 | Wt 145.6 lb

## 2015-08-10 DIAGNOSIS — Z833 Family history of diabetes mellitus: Secondary | ICD-10-CM | POA: Diagnosis not present

## 2015-08-10 DIAGNOSIS — Z87891 Personal history of nicotine dependence: Secondary | ICD-10-CM | POA: Insufficient documentation

## 2015-08-10 DIAGNOSIS — Z79899 Other long term (current) drug therapy: Secondary | ICD-10-CM | POA: Diagnosis not present

## 2015-08-10 DIAGNOSIS — I2510 Atherosclerotic heart disease of native coronary artery without angina pectoris: Secondary | ICD-10-CM

## 2015-08-10 DIAGNOSIS — I5021 Acute systolic (congestive) heart failure: Secondary | ICD-10-CM | POA: Diagnosis not present

## 2015-08-10 DIAGNOSIS — Z8249 Family history of ischemic heart disease and other diseases of the circulatory system: Secondary | ICD-10-CM | POA: Insufficient documentation

## 2015-08-10 DIAGNOSIS — R911 Solitary pulmonary nodule: Secondary | ICD-10-CM | POA: Diagnosis not present

## 2015-08-10 DIAGNOSIS — I110 Hypertensive heart disease with heart failure: Secondary | ICD-10-CM | POA: Insufficient documentation

## 2015-08-10 DIAGNOSIS — J449 Chronic obstructive pulmonary disease, unspecified: Secondary | ICD-10-CM | POA: Diagnosis not present

## 2015-08-10 DIAGNOSIS — Z7982 Long term (current) use of aspirin: Secondary | ICD-10-CM | POA: Diagnosis not present

## 2015-08-10 DIAGNOSIS — I255 Ischemic cardiomyopathy: Secondary | ICD-10-CM | POA: Insufficient documentation

## 2015-08-10 DIAGNOSIS — I2583 Coronary atherosclerosis due to lipid rich plaque: Secondary | ICD-10-CM

## 2015-08-10 DIAGNOSIS — Z8547 Personal history of malignant neoplasm of testis: Secondary | ICD-10-CM | POA: Diagnosis not present

## 2015-08-10 DIAGNOSIS — E785 Hyperlipidemia, unspecified: Secondary | ICD-10-CM | POA: Diagnosis not present

## 2015-08-10 DIAGNOSIS — I5022 Chronic systolic (congestive) heart failure: Secondary | ICD-10-CM | POA: Insufficient documentation

## 2015-08-10 LAB — BASIC METABOLIC PANEL
ANION GAP: 9 (ref 5–15)
BUN: 30 mg/dL — ABNORMAL HIGH (ref 6–20)
CALCIUM: 9.5 mg/dL (ref 8.9–10.3)
CO2: 24 mmol/L (ref 22–32)
Chloride: 101 mmol/L (ref 101–111)
Creatinine, Ser: 1.06 mg/dL (ref 0.61–1.24)
GLUCOSE: 98 mg/dL (ref 65–99)
POTASSIUM: 4.3 mmol/L (ref 3.5–5.1)
SODIUM: 134 mmol/L — AB (ref 135–145)

## 2015-08-10 LAB — DIGOXIN LEVEL: DIGOXIN LVL: 2.5 ng/mL — AB (ref 0.8–2.0)

## 2015-08-10 MED ORDER — FUROSEMIDE 20 MG PO TABS: 20.0000 mg | ORAL_TABLET | ORAL | Status: DC

## 2015-08-10 NOTE — Patient Instructions (Signed)
TAKE Lasix every other day.  Routine lab work today. (bmet dig) Will notify you of abnormal results  FOLLOW UP: 2 weeks with Dr. Haroldine Laws  Do the following things EVERYDAY: 1) Weigh yourself in the morning before breakfast. Write it down and keep it in a log. 2) Take your medicines as prescribed 3) Eat low salt foods-Limit salt (sodium) to 2000 mg per day.  4) Stay as active as you can everyday 5) Limit all fluids for the day to less than 2 liters

## 2015-08-10 NOTE — Progress Notes (Signed)
Advanced Heart Failure Medication Review by a Pharmacist  Does the patient  feel that his/her medications are working for him/her?  yes  Has the patient been experiencing any side effects to the medications prescribed?  no  Does the patient measure his/her own blood pressure or blood glucose at home?  no   Does the patient have any problems obtaining medications due to transportation or finances?   no  Understanding of regimen: good Understanding of indications: good Potential of compliance: good Patient understands to avoid NSAIDs. Patient understands to avoid decongestants.  Issues to address at subsequent visits: None   Pharmacist comments:  Jordan Johnston is a pleasant 64 yo M presenting with his son and an up-to-date medication list. They report excellent compliance with all of his medications but have not yet picked up the fish oil yet. They did not have any specific medication-related questions or concerns at this time.   Ruta Hinds. Velva Harman, PharmD, BCPS, CPP Clinical Pharmacist Pager: [phone] Phone: [phone] 08/10/2015 11:38 AM      Time with patient: 4 minutes Preparation and documentation time: 2 minutes Total time: 6 minutes

## 2015-08-10 NOTE — Progress Notes (Signed)
Patient ID: Jordan Johnston, male   DOB: October 02, 1950, 64 y.o.   MRN: [id number]   [id number] Primary HF Cardiologist: Dr Haroldine Laws Primary Cardiologist: Dr Gwenlyn Found  Vascular : Dr Gwenlyn Found   HPI: Jordan Johnston is a 64 y.o. male with a history of testicular cancer, HTN, HLD, carotid stenosis s/p R CEA (2009), former tobacco abuse, LV dysfunction EF 10-15% 07/2015, CAD with known 100% LCx occlusion, mild AS/ mild MR, and chronic systolic heart failure.   Admitted 07/26/2015 with increase dyspnea and leg edema. Prior to admit he went to urgent care ~ 1 month ago and was diagnosed with CAP-->placed on Abx. He continued to decline. Take to cath lab for RHC/LHC as noted below. Diuresed with IV lasix and transitioned to 20 mg po lasix daily. He was not placed on bb due to hypotension. Discharge weight was 148 pounds.   Today he returns for HF follow up with his brother. Overall feeing ok. Denies SOB/PND/Orthopnea. Has to take his time walking. Mild dyspnea with exertion. Day time fatigue. Weight at home 147-148 pounds. Taking all medications. His brother prepares medications. Appetite ok. Has not smoked since discharge.     RHC/LHC 07/27/2015 PCWP 20  PVR 2.7 CO/CI 4.7 CI 2.43  -Mid CX 100% stenosed R to L collaterals. Mid RCA 30%. Ost70% stenosed. Mild disease in the LAD. Medically managed.   ECHO 07/27/2015: EF 10-15%. With mild AS. RV mod dysfunction.   Labs 08/01/2015: K 4.6 Creatinine 1.07   FH: Brother CAD SH: Lives with brother. Former smoker. Does not drink alcohol.    ROS: All systems negative except as listed in HPI, PMH and Problem List.  SH:  Social History   Social History  . Marital Status: Single    Spouse Name: N/A  . Number of Children: N/A  . Years of Education: N/A   Occupational History  . Not on file.   Social History Main Topics  . Smoking status: Former Smoker -- 1.00 packs/day for 40 years    Types: Cigarettes    Quit date: 05/27/2015  . Smokeless tobacco: Never Used    . Alcohol Use: No  . Drug Use: No  . Sexual Activity: Not on file   Other Topics Concern  . Not on file   Social History Narrative    FH:  Family History  Problem Relation Age of Onset  . Diabetes Mother   . Alzheimer's disease Mother   . Coronary artery disease Father   . Melanoma Father     Past Medical History  Diagnosis Date  . History of testicular cancer     Remote, 30 yrs +  . Hernia   . History of CEA (carotid endarterectomy)     Right-sided in 2009  . HTN (hypertension)   . HLD (hyperlipidemia)   . CAD (coronary artery disease)   . CHF (congestive heart failure) ([place])   . Shortness of breath dyspnea     Current Outpatient Prescriptions  Medication Sig Dispense Refill  . albuterol (PROVENTIL HFA;VENTOLIN HFA) 108 (90 BASE) MCG/ACT inhaler Inhale 2 puffs into the lungs every 6 (six) hours as needed for wheezing or shortness of breath.    Marland Kitchen aspirin 81 MG tablet Take 81 mg by mouth daily.    Marland Kitchen atorvastatin (LIPITOR) 80 MG tablet Take 1 tablet (80 mg total) by mouth daily at 6 PM. 30 tablet 11  . digoxin (LANOXIN) 0.25 MG tablet Take 1 tablet (0.25 mg total) by mouth daily. 30 tablet  11  . furosemide (LASIX) 20 MG tablet Take 1 tablet (20 mg total) by mouth daily. 30 tablet 11  . losartan (COZAAR) 25 MG tablet Take 0.5 tablets (12.5 mg total) by mouth daily. 30 tablet 11  . spironolactone (ALDACTONE) 25 MG tablet Take 0.5 tablets (12.5 mg total) by mouth daily. 30 tablet 5  . Omega-3 Fatty Acids (FISH OIL) 1000 MG CAPS Take 1,000 mg by mouth daily.     No current facility-administered medications for this encounter.    Filed Vitals:   08/10/15 1132  BP: 100/62  Pulse: 82  Weight: 145 lb 9.6 oz (66.044 kg)  SpO2: 99%    PHYSICAL EXAM:  General:  Chronically ill appearing. No resp difficulty. Brother present.  HEENT: normal Neck: supple. JVP flat. Carotids 2+ bilaterally; no bruits. No lymphadenopathy or thryomegaly appreciated. Cor: PMI normal. Regular  rate & rhythm. No rubs or murmurs. + S3  Lungs: clear Abdomen: soft, nontender, nondistended. No hepatosplenomegaly. No bruits or masses. Good bowel sounds. Extremities: no cyanosis, clubbing, rash, edema Neuro: alert & orientedx3, cranial nerves grossly intact. Moves all 4 extremities w/o difficulty. Affect pleasant.  ASSESSMENT & PLAN: 1. Chronic Systolic Heart Failure, ICM  Recently hospitalized the end of October with ADHF. ECHO 07/27/2015 EF 10-15%.  Diuresed and started on HF meds.  NYHA III-Volume status low. Cut back lasix to every other day and may even need to stop at next visit.  Continue spiro 25 mg daily. Continue losartan 12.5 mg daily.  Hold off on bb for now. While hospitalized he was unable to tolerate med titration due to hypotension. Will likely need CPX at some point.  Plan to check ECHO after HF meds optimized.  Today we discussed advanced therapies. This included home inotropes, heart transplant, LVAD, versus Hospice.  Check BMET and dig level today.   2.CAD- On LHC 07/27/2015  As well as CT of chest   Mid Cx lesion, 100% stenosed. Right to left collaterals fill a large OM2.  Mid RCA to Dist RCA lesion, 30% stenosed.  Ost 2nd Diag lesion, 70% stenosed. Mild disease in the LAD. On aspirin and statin. No bb for now with concern for low output.  3. Former Smoker  4. Hyperlipidemia- on statin 5. Pulmonary Nodule- Noted on CT of chest 2013 and  2016 with no change in nodule.   6. COPD/Empysema - on CT of chest 07/29/2015  7. CEA- S/P R CEA 2009 8. Mild AS on ECHO.   Follow up with Dr Haroldine Laws in 2 weeks.

## 2015-08-12 ENCOUNTER — Telehealth (HOSPITAL_COMMUNITY): Payer: Self-pay

## 2015-08-12 NOTE — Telephone Encounter (Signed)
Patient given results of digoxin level.  Told to discontinue for 2 weeks.  Patient repeated instructions.  Will have OV 11/29; reconsider starting digoxin at a lower dose

## 2015-08-13 ENCOUNTER — Telehealth (HOSPITAL_COMMUNITY): Payer: Self-pay

## 2015-08-13 NOTE — Telephone Encounter (Signed)
Returned call to patient LVMTCB 

## 2015-08-24 ENCOUNTER — Encounter (HOSPITAL_COMMUNITY): Payer: Self-pay | Admitting: Internal Medicine

## 2015-08-24 ENCOUNTER — Ambulatory Visit (HOSPITAL_COMMUNITY)
Admission: RE | Admit: 2015-08-24 | Discharge: 2015-08-24 | Disposition: A | Discharge status: Home / Self Care | Payer: 59 | Source: Ambulatory Visit | Attending: Internal Medicine | Admitting: Internal Medicine

## 2015-08-24 VITALS — BP 72/48 | HR 87 | Wt 149.8 lb

## 2015-08-24 DIAGNOSIS — Z8547 Personal history of malignant neoplasm of testis: Secondary | ICD-10-CM | POA: Insufficient documentation

## 2015-08-24 DIAGNOSIS — Z87891 Personal history of nicotine dependence: Secondary | ICD-10-CM | POA: Insufficient documentation

## 2015-08-24 DIAGNOSIS — I429 Cardiomyopathy, unspecified: Secondary | ICD-10-CM | POA: Diagnosis not present

## 2015-08-24 DIAGNOSIS — T501X5A Adverse effect of loop [high-ceiling] diuretics, initial encounter: Secondary | ICD-10-CM | POA: Diagnosis not present

## 2015-08-24 DIAGNOSIS — Z79899 Other long term (current) drug therapy: Secondary | ICD-10-CM | POA: Diagnosis not present

## 2015-08-24 DIAGNOSIS — I10 Essential (primary) hypertension: Secondary | ICD-10-CM | POA: Insufficient documentation

## 2015-08-24 DIAGNOSIS — J449 Chronic obstructive pulmonary disease, unspecified: Secondary | ICD-10-CM | POA: Insufficient documentation

## 2015-08-24 DIAGNOSIS — I952 Hypotension due to drugs: Secondary | ICD-10-CM | POA: Insufficient documentation

## 2015-08-24 DIAGNOSIS — I5022 Chronic systolic (congestive) heart failure: Secondary | ICD-10-CM

## 2015-08-24 DIAGNOSIS — Y929 Unspecified place or not applicable: Secondary | ICD-10-CM | POA: Insufficient documentation

## 2015-08-24 DIAGNOSIS — I2510 Atherosclerotic heart disease of native coronary artery without angina pectoris: Secondary | ICD-10-CM

## 2015-08-24 DIAGNOSIS — E785 Hyperlipidemia, unspecified: Secondary | ICD-10-CM | POA: Insufficient documentation

## 2015-08-24 DIAGNOSIS — R911 Solitary pulmonary nodule: Secondary | ICD-10-CM | POA: Insufficient documentation

## 2015-08-24 DIAGNOSIS — Z7982 Long term (current) use of aspirin: Secondary | ICD-10-CM | POA: Diagnosis not present

## 2015-08-24 MED ORDER — FUROSEMIDE 20 MG PO TABS: ORAL_TABLET | ORAL | Status: DC

## 2015-08-24 NOTE — Patient Instructions (Signed)
FOLLOW UP: 2 weeks   Do the following things EVERYDAY: 1) Weigh yourself in the morning before breakfast. Write it down and keep it in a log. 2) Take your medicines as prescribed 3) Eat low salt foods-Limit salt (sodium) to 2000 mg per day.  4) Stay as active as you can everyday 5) Limit all fluids for the day to less than 2 liters

## 2015-08-24 NOTE — Progress Notes (Signed)
Patient ID: Jordan Johnston, male   DOB: 23-May-1951, 64 y.o.   MRN: [id number]   [id number] Primary HF Cardiologist: Dr Haroldine Laws Primary Cardiologist: Dr Gwenlyn Found  Vascular : Dr Gwenlyn Found   HPI: Jordan Johnston is a 64 y.o. male with a history of testicular cancer, HTN, HLD, carotid stenosis s/p R CEA (2009), former tobacco abuse, LV dysfunction EF 10-15% 07/2015, CAD with known 100% LCx occlusion, mild AS/ mild MR, and chronic systolic heart failure.   Admitted 07/26/2015 with increase dyspnea and leg edema. Prior to admit he went to urgent care ~ 1 month ago and was diagnosed with CAP-->placed on Abx. He continued to decline. Take to cath lab for RHC/LHC as noted below. Diuresed with IV lasix and transitioned to 20 mg po lasix daily. He was not placed on bb due to hypotension. Discharge weight was 148 pounds.   Today he returns for HF follow up with his brother. Last visit lasix was cut back to every other day  And digoxin was stopped due to dig level 2.4. Overall doing ok. Denies SOB/PND/Orthopnea.  Working 8-10 hours per day.  Weight at home  145-148 pounds. Taking all medications. His brother prepares medications. Appetite ok. Has not smoked since discharge.     RHC/LHC 07/27/2015 PCWP 20  PVR 2.7 CO/CI 4.7 CI 2.43  -Mid CX 100% stenosed R to L collaterals. Mid RCA 30%. Ost70% stenosed. Mild disease in the LAD. Medically managed.   ECHO 07/27/2015: EF 10-15%. With mild AS. RV mod dysfunction.   Labs 08/10/2015: K 4.3 Creatinine 1.06 Dig level 2.5  Labs 08/01/2015: K 4.6 Creatinine 1.07   FH: Brother CAD SH: Lives with brother. Former smoker. Does not drink alcohol.    ROS: All systems negative except as listed in HPI, PMH and Problem List.  SH:  Social History   Social History  . Marital Status: Single    Spouse Name: N/A  . Number of Children: N/A  . Years of Education: N/A   Occupational History  . Not on file.   Social History Main Topics  . Smoking status: Former Smoker -- 1.00  packs/day for 40 years    Types: Cigarettes    Quit date: 05/27/2015  . Smokeless tobacco: Never Used  . Alcohol Use: No  . Drug Use: No  . Sexual Activity: Not on file   Other Topics Concern  . Not on file   Social History Narrative    FH:  Family History  Problem Relation Age of Onset  . Diabetes Mother   . Alzheimer's disease Mother   . Coronary artery disease Father   . Melanoma Father     Past Medical History  Diagnosis Date  . History of testicular cancer     Remote, 30 yrs +  . Hernia   . History of CEA (carotid endarterectomy)     Right-sided in 2009  . HTN (hypertension)   . HLD (hyperlipidemia)   . CAD (coronary artery disease)   . CHF (congestive heart failure) ([place])   . Shortness of breath dyspnea     Current Outpatient Prescriptions  Medication Sig Dispense Refill  . aspirin 81 MG tablet Take 81 mg by mouth daily.    Marland Kitchen atorvastatin (LIPITOR) 80 MG tablet Take 1 tablet (80 mg total) by mouth daily at 6 PM. 30 tablet 11  . furosemide (LASIX) 20 MG tablet Take 1 tablet (20 mg total) by mouth every other day. 15 tablet 11  . losartan (COZAAR)  25 MG tablet Take 0.5 tablets (12.5 mg total) by mouth daily. 30 tablet 11  . Omega-3 Fatty Acids (FISH OIL) 1000 MG CAPS Take 1,000 mg by mouth daily.    Marland Kitchen spironolactone (ALDACTONE) 25 MG tablet Take 0.5 tablets (12.5 mg total) by mouth daily. 30 tablet 5  . albuterol (PROVENTIL HFA;VENTOLIN HFA) 108 (90 BASE) MCG/ACT inhaler Inhale 2 puffs into the lungs every 6 (six) hours as needed for wheezing or shortness of breath.     No current facility-administered medications for this encounter.    Filed Vitals:   08/24/15 1414  BP: 72/48  Pulse: 87  Weight: 149 lb 12 oz (67.926 kg)  SpO2: 99%    PHYSICAL EXAM:  General:  Walked into clinic. NAD. No resp difficulty. Brother present.  HEENT: normal x poor dentition Neck: supple. JVP flat. Carotids 2+ bilaterally; no bruits. No lymphadenopathy or thryomegaly  appreciated. Cor: PMI laterally displaced. Regular rate & rhythm. No rubs or murmurs. No S3  Lungs: clear with diminished BS Abdomen: soft, nontender, nondistended. No hepatosplenomegaly. No bruits or masses. Good bowel sounds. Extremities: no cyanosis, clubbing, rash, edema Neuro: alert & orientedx3, cranial nerves grossly intact. Moves all 4 extremities w/o difficulty. Affect pleasant.  ASSESSMENT & PLAN: 1. 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 ECHO 07/27/2015 EF 10-15%.  Diuresed and started on HF meds. While hospitalized he was unable to tolerate med titration due to hypotension. NYHA  II-III. Today he is hypotension likely due to lasix he had earlier today. Volume status low. Cut back lasix to twice a week and he will only  take for weight 150 pounds or greater.  Continue spiro 25 mg daily. Continue losartan 12.5 mg daily.  Hold off on bb for now with hypotension.  Will likely need CPX at some point.  Plan to check ECHO after HF meds optimized. --> January  2.CAD- On LHC 07/27/2015  As well as CT of chest   Mid Cx lesion, 100% stenosed. Right to left collaterals fill a large OM2.  Mid RCA to Dist RCA lesion, 30% stenosed.  Ost 2nd Diag lesion, 70% stenosed. Mild disease in the LAD. No chest pain. On aspirin and statin. No bb for now with concern for low output.  3. Former Smoker  4. Hyperlipidemia- on statin 5. Pulmonary Nodule- Noted on CT of chest 2013 and  2016 with no change in nodule.   6. COPD/Empysema - on CT of chest 07/29/2015  7. CEA- S/P R CEA 2009 8. Mild AS on ECHO.   Follow up 2 weeks and consider adding digoxin at the next visit.   Amy Clegg NP-C  Patient seen and examined with Darrick Grinder, NP. We discussed all aspects of the encounter. I agree with the assessment and plan as stated above.   Overall improved NYHA II-III. Volume status low. BP very soft but  asymptomatic. Agree with decreasing lasix. Would try to restart digoxin at next visit at 0.0625 mg daily. Repeat echo in January to look for LV recovery. CAD stable without ischemic symptoms. Follow closely.   Demorris Choyce,MD 12:09 AM

## 2015-08-25 DIAGNOSIS — I5022 Chronic systolic (congestive) heart failure: Secondary | ICD-10-CM | POA: Insufficient documentation

## 2015-09-07 ENCOUNTER — Ambulatory Visit (HOSPITAL_COMMUNITY)
Admission: RE | Admit: 2015-09-07 | Discharge: 2015-09-07 | Disposition: A | Discharge status: Home / Self Care | Payer: 59 | Source: Ambulatory Visit | Attending: Internal Medicine | Admitting: Internal Medicine

## 2015-09-07 VITALS — HR 98 | Wt 151.0 lb

## 2015-09-07 DIAGNOSIS — R911 Solitary pulmonary nodule: Secondary | ICD-10-CM | POA: Diagnosis not present

## 2015-09-07 DIAGNOSIS — E785 Hyperlipidemia, unspecified: Secondary | ICD-10-CM | POA: Diagnosis not present

## 2015-09-07 DIAGNOSIS — I2510 Atherosclerotic heart disease of native coronary artery without angina pectoris: Secondary | ICD-10-CM | POA: Insufficient documentation

## 2015-09-07 DIAGNOSIS — Z87891 Personal history of nicotine dependence: Secondary | ICD-10-CM | POA: Diagnosis not present

## 2015-09-07 DIAGNOSIS — I5022 Chronic systolic (congestive) heart failure: Secondary | ICD-10-CM | POA: Diagnosis not present

## 2015-09-07 DIAGNOSIS — J449 Chronic obstructive pulmonary disease, unspecified: Secondary | ICD-10-CM | POA: Diagnosis not present

## 2015-09-07 MED ORDER — IVABRADINE HCL 5 MG PO TABS
2.5000 mg | ORAL_TABLET | Freq: Two times a day (BID) | ORAL | Status: DC
Start: 2015-09-07 — End: 2015-09-14

## 2015-09-07 MED ORDER — FUROSEMIDE 20 MG PO TABS: ORAL_TABLET | ORAL | Status: DC

## 2015-09-07 NOTE — Patient Instructions (Addendum)
CHANGE Lasix to 20 mg every Tuesday and Saturday for weight greater than 155 lbs START Corlanor 2.5 mg (1/2 tab) ,twice a day  Your physician recommends that you schedule a follow-up appointment in: 1 week  Do the following things EVERYDAY: 1) Weigh yourself in the morning before breakfast. Write it down and keep it in a log. 2) Take your medicines as prescribed 3) Eat low salt foods-Limit salt (sodium) to 2000 mg per day.  4) Stay as active as you can everyday 5) Limit all fluids for the day to less than 2 liters 6)

## 2015-09-07 NOTE — Progress Notes (Signed)
Patient ID: Jordan Johnston, male   DOB: 30-Jun-1951, 64 y.o.   MRN: [id number]   [id number] Primary HF Cardiologist: Dr Haroldine Laws Primary Cardiologist: Dr Gwenlyn Found  Vascular : Dr Gwenlyn Found   HPI: Jordan Johnston is a 64 y.o. male with a history of testicular cancer, HTN, HLD, carotid stenosis s/p R CEA (2009), former tobacco abuse, LV dysfunction EF 10-15% 07/2015, CAD with known 100% LCx occlusion, mild AS/ mild MR, and chronic systolic heart failure.   Admitted 07/26/2015 with increase dyspnea and leg edema. Prior to admit he went to urgent care ~ 1 month ago and was diagnosed with CAP-->placed on Abx. He continued to decline. Take to cath lab for RHC/LHC as noted below. Diuresed with IV lasix and transitioned to 20 mg po lasix daily. He was not placed on bb due to hypotension. Discharge weight was 148 pounds.   Today he returns for HF follow up with his brother. Last visit lasix was cut back to twice a week only as needed for weight 150 or greater.  Last dose of lasix was 12/5 for weight 151 pounds. Denies SOB/PND/Orthonea/CP. Working 8-10 hours per day. Weight at home 149-150 pounds. Appetitie ok. Has not smoked.    RHC/LHC 07/27/2015 PCWP 20  PVR 2.7 CO/CI 4.7 CI 2.43  -Mid CX 100% stenosed R to L collaterals. Mid RCA 30%. Ost70% stenosed. Mild disease in the LAD. Medically managed.   ECHO 07/27/2015: EF 10-15%. With mild AS. RV mod dysfunction.   Labs 08/10/2015: K 4.3 Creatinine 1.06 Dig level 2.5  Labs 08/01/2015: K 4.6 Creatinine 1.07   FH: Brother CAD SH: Lives with brother. Former smoker. Does not drink alcohol.    ROS: All systems negative except as listed in HPI, PMH and Problem List.  SH:  Social History   Social History  . Marital Status: Single    Spouse Name: N/A  . Number of Children: N/A  . Years of Education: N/A   Occupational History  . Not on file.   Social History Main Topics  . Smoking status: Former Smoker -- 1.00 packs/day for 40 years    Types: Cigarettes   Quit date: 05/27/2015  . Smokeless tobacco: Never Used  . Alcohol Use: No  . Drug Use: No  . Sexual Activity: Not on file   Other Topics Concern  . Not on file   Social History Narrative    FH:  Family History  Problem Relation Age of Onset  . Diabetes Mother   . Alzheimer's disease Mother   . Coronary artery disease Father   . Melanoma Father     Past Medical History  Diagnosis Date  . History of testicular cancer     Remote, 30 yrs +  . Hernia   . History of CEA (carotid endarterectomy)     Right-sided in 2009  . HTN (hypertension)   . HLD (hyperlipidemia)   . CAD (coronary artery disease)   . CHF (congestive heart failure) ([place])   . Shortness of breath dyspnea     Current Outpatient Prescriptions  Medication Sig Dispense Refill  . aspirin 81 MG tablet Take 81 mg by mouth daily.    Marland Kitchen atorvastatin (LIPITOR) 80 MG tablet Take 1 tablet (80 mg total) by mouth daily at 6 PM. 30 tablet 11  . furosemide (LASIX) 20 MG tablet Take one tablet EVERY Tuesday and Saturday if your weight is over 150lbs 15 tablet 11  . losartan (COZAAR) 25 MG tablet Take 0.5 tablets (12.5  mg total) by mouth daily. 30 tablet 11  . Omega-3 Fatty Acids (FISH OIL) 1000 MG CAPS Take 1,000 mg by mouth daily.    Marland Kitchen spironolactone (ALDACTONE) 25 MG tablet Take 0.5 tablets (12.5 mg total) by mouth daily. 30 tablet 5   No current facility-administered medications for this encounter.    Filed Vitals:   09/07/15 1353  Pulse: 98  Weight: 151 lb (68.493 kg)  SpO2: 97%    PHYSICAL EXAM:  General:  Walked into clinic. NAD. No resp difficulty. Brother present.  HEENT: normal x poor dentition Neck: supple. JVP flat. Carotids 2+ bilaterally; no bruits. No lymphadenopathy or thryomegaly appreciated. Cor: PMI laterally displaced. Regular rate & rhythm. No rubs or murmurs. No S3  Lungs: clear with diminished BS Abdomen: soft, nontender, nondistended. No hepatosplenomegaly. No bruits or masses. Good bowel  sounds. Extremities: no cyanosis, clubbing, rash, edema Neuro: alert & orientedx3, cranial nerves grossly intact. Moves all 4 extremities w/o difficulty. Affect pleasant.  ASSESSMENT & PLAN: 1. 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 ECHO 07/27/2015 EF 10-15%.  Diuresed and started on HF meds. While hospitalized he was unable to tolerate med titration due to hypotension. NYHA  II. Volume status stable.  Change lasix to as needed for weight 155 pounds or greater.   -Continue losartan 12.5 mg daily.   Hold off on bb for now with hypotension. Add 2.5 mg corlanor twice a day. If SBP still down may need to stop spiro and or losartan.  Will likely need CPX at some point.  Plan to check ECHO after HF meds optimized. --> January  2.CAD- On LHC 07/27/2015  As well as CT of chest   Mid Cx lesion, 100% stenosed. Right to left collaterals fill a large OM2.  Mid RCA to Dist RCA lesion, 30% stenosed.  Ost 2nd Diag lesion, 70% stenosed. Mild disease in the LAD. No chest pain. On aspirin and statin. No bb for now with concern for low output.  3. Former Smoker  4. Hyperlipidemia- on statin 5. Pulmonary Nodule- Noted on CT of chest 2013 and  2016 with no change in nodule.   6. COPD/Empysema - on CT of chest 07/29/2015  7. CEA- S/P R CEA 2009 8. Mild AS on ECHO.   Follow up next week.    Delphia Kaylor NP-C

## 2015-09-14 ENCOUNTER — Ambulatory Visit (HOSPITAL_COMMUNITY)
Admission: RE | Admit: 2015-09-14 | Discharge: 2015-09-14 | Disposition: A | Discharge status: Home / Self Care | Payer: 59 | Source: Ambulatory Visit | Attending: Cardiology | Admitting: Cardiology

## 2015-09-14 VITALS — BP 86/42 | HR 82 | Wt 151.2 lb

## 2015-09-14 DIAGNOSIS — I10 Essential (primary) hypertension: Secondary | ICD-10-CM | POA: Insufficient documentation

## 2015-09-14 DIAGNOSIS — E785 Hyperlipidemia, unspecified: Secondary | ICD-10-CM | POA: Insufficient documentation

## 2015-09-14 DIAGNOSIS — R911 Solitary pulmonary nodule: Secondary | ICD-10-CM | POA: Diagnosis not present

## 2015-09-14 DIAGNOSIS — I5022 Chronic systolic (congestive) heart failure: Secondary | ICD-10-CM

## 2015-09-14 DIAGNOSIS — Z8547 Personal history of malignant neoplasm of testis: Secondary | ICD-10-CM | POA: Insufficient documentation

## 2015-09-14 DIAGNOSIS — Z79899 Other long term (current) drug therapy: Secondary | ICD-10-CM | POA: Diagnosis not present

## 2015-09-14 DIAGNOSIS — I2510 Atherosclerotic heart disease of native coronary artery without angina pectoris: Secondary | ICD-10-CM | POA: Diagnosis not present

## 2015-09-14 DIAGNOSIS — J449 Chronic obstructive pulmonary disease, unspecified: Secondary | ICD-10-CM | POA: Insufficient documentation

## 2015-09-14 DIAGNOSIS — Z7982 Long term (current) use of aspirin: Secondary | ICD-10-CM | POA: Diagnosis not present

## 2015-09-14 DIAGNOSIS — Z87891 Personal history of nicotine dependence: Secondary | ICD-10-CM | POA: Diagnosis not present

## 2015-09-14 DIAGNOSIS — I429 Cardiomyopathy, unspecified: Secondary | ICD-10-CM | POA: Insufficient documentation

## 2015-09-14 MED ORDER — IVABRADINE HCL 5 MG PO TABS: 2.5000 mg | ORAL_TABLET | Freq: Two times a day (BID) | ORAL | Status: DC

## 2015-09-14 NOTE — Progress Notes (Signed)
Advanced Heart Failure Medication Review by a Pharmacist  Does the patient  feel that his/her medications are working for him/her?  yes  Has the patient been experiencing any side effects to the medications prescribed?  no  Does the patient measure his/her own blood pressure or blood glucose at home?  no   Does the patient have any problems obtaining medications due to transportation or finances?   no  Understanding of regimen: good Understanding of indications: good Potential of compliance: good Patient understands to avoid NSAIDs. Patient understands to avoid decongestants.  Issues to address at subsequent visits: None   Pharmacist comments:  Jordan Johnston is a pleasant 64 yo M presenting with his son but without a medication list. He reports excellent compliance with his regimen but states that he did have an episode of dizziness/light headedness since starting the Corlanor. He did not have any other medication-related questions or concerns at this time.   Ruta Hinds. Velva Harman, PharmD, BCPS, CPP Clinical Pharmacist Pager: [phone] Phone: [phone] 09/14/2015 3:06 PM      Time with patient: 6 minutes Preparation and documentation time: 2 minutes Total time: 8 minutes

## 2015-09-14 NOTE — Patient Instructions (Signed)
Continue Corlanor 2.5 mg, one half tab twice a day  Your physician recommends that you schedule a follow-up appointment in: 3 weeks with Dr.Bensimhon  Do the following things EVERYDAY: 1) Weigh yourself in the morning before breakfast. Write it down and keep it in a log. 2) Take your medicines as prescribed 3) Eat low salt foods-Limit salt (sodium) to 2000 mg per day.  4) Stay as active as you can everyday 5) Limit all fluids for the day to less than 2 liters 6)

## 2015-09-14 NOTE — Progress Notes (Signed)
Patient ID: Jordan Johnston, male   DOB: [birth year], 64 y.o.   MRN: [id number]   [id number] Primary HF Cardiologist: Dr Haroldine Laws Primary Cardiologist: Dr Gwenlyn Found  Vascular : Dr Gwenlyn Found   HPI: CHANSE WEISENBACH is a 64 y.o. male with a history of testicular cancer, HTN, HLD, carotid stenosis s/p R CEA (2009), former tobacco abuse, LV dysfunction EF 10-15% 07/2015, CAD with known 100% LCx occlusion, mild AS/ mild MR, and chronic systolic heart failure.   Admitted 07/26/2015 with increase dyspnea and leg edema. Prior to admit he went to urgent care ~ 1 month ago and was diagnosed with CAP-->placed on Abx. He continued to decline. Take to cath lab for RHC/LHC as noted below. Diuresed with IV lasix and transitioned to 20 mg po lasix daily. He was not placed on bb due to hypotension. Discharge weight was 148 pounds.   Today he returns for HF follow up with his brother. Last visit corlanor 2.5 mg twice a day due to hypotension and elevated heart rate.  Last dose of lasix was 12/5 for weight 151 pounds. Denies SOB/PND/Orthonea/CP. Working 8-10 hours per day. Weight at home 150-151 pounds. Appetitie ok. Has not smoked since November.   RHC/LHC 07/27/2015 PCWP 20  PVR 2.7 CO/CI 4.7 CI 2.43  -Mid CX 100% stenosed R to L collaterals. Mid RCA 30%. Ost70% stenosed. Mild disease in the LAD. Medically managed.   ECHO 07/27/2015: EF 10-15%. With mild AS. RV mod dysfunction.   Labs 08/10/2015: K 4.3 Creatinine 1.06 Dig level 2.5  Labs 08/01/2015: K 4.6 Creatinine 1.07  Labs 08/10/2015: K 4.3 Creatinine 1.06   FH: Brother CAD SH: Lives with brother. Former smoker. Does not drink alcohol.    ROS: All systems negative except as listed in HPI, PMH and Problem List.  SH:  Social History   Social History  . Marital Status: Single    Spouse Name: N/A  . Number of Children: N/A  . Years of Education: N/A   Occupational History  . Not on file.   Social History Main Topics  . Smoking status: Former Smoker -- 1.00  packs/day for 40 years    Types: Cigarettes    Quit date: 05/27/2015  . Smokeless tobacco: Never Used  . Alcohol Use: No  . Drug Use: No  . Sexual Activity: Not on file   Other Topics Concern  . Not on file   Social History Narrative    FH:  Family History  Problem Relation Age of Onset  . Diabetes Mother   . Alzheimer's disease Mother   . Coronary artery disease Father   . Melanoma Father     Past Medical History  Diagnosis Date  . History of testicular cancer     Remote, 30 yrs +  . Hernia   . History of CEA (carotid endarterectomy)     Right-sided in 2009  . HTN (hypertension)   . HLD (hyperlipidemia)   . CAD (coronary artery disease)   . CHF (congestive heart failure) ([place])   . Shortness of breath dyspnea     Current Outpatient Prescriptions  Medication Sig Dispense Refill  . aspirin 81 MG tablet Take 81 mg by mouth daily.    Marland Kitchen atorvastatin (LIPITOR) 80 MG tablet Take 1 tablet (80 mg total) by mouth daily at 6 PM. 30 tablet 11  . ivabradine (CORLANOR) 5 MG TABS tablet Take 0.5 tablets (2.5 mg total) by mouth 2 (two) times daily with a meal. 60 tablet   .  losartan (COZAAR) 25 MG tablet Take 0.5 tablets (12.5 mg total) by mouth daily. 30 tablet 11  . spironolactone (ALDACTONE) 25 MG tablet Take 0.5 tablets (12.5 mg total) by mouth daily. 30 tablet 5  . furosemide (LASIX) 20 MG tablet Take one tablet EVERY Tuesday and Saturday if your weight is over 155 bs (Patient not taking: Reported on 09/14/2015) 15 tablet 11   No current facility-administered medications for this encounter.    Filed Vitals:   09/14/15 1503  BP: 86/42  Pulse: 82  Weight: 151 lb 3.2 oz (68.584 kg)  SpO2: 99%    PHYSICAL EXAM:  General:  Walked into clinic. NAD. No resp difficulty. Brother present.  HEENT: normal x poor dentition Neck: supple. JVP flat. Carotids 2+ bilaterally; no bruits. No lymphadenopathy or thryomegaly appreciated. Cor: PMI laterally displaced. Regular rate &  rhythm. No rubs or murmurs. No S3  Lungs: clear with diminished BS Abdomen: soft, nontender, nondistended. No hepatosplenomegaly. No bruits or masses. Good bowel sounds. Extremities: no cyanosis, clubbing, rash, edema Neuro: alert & orientedx3, cranial nerves grossly intact. Moves all 4 extremities w/o difficulty. Affect pleasant.  ASSESSMENT & PLAN: 1. 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 ECHO 07/27/2015 EF 10-15%.  Diuresed and started on HF meds. While hospitalized he was unable to tolerate med titration due to hypotension. NYHA  II. Volume status stable.  Continue lasix as needed for weight 155 pounds or greater.   -Continue losartan 12.5 mg daily and 12.5 mg spiro daily.  Hold off on bb for now with hypotension.  Todays BP and heart rate much better on 2.5 mg corlanor twice a day. Continue corlanor 2.5 mg twice a day.  Will likely need CPX at some point.  Plan to check ECHO after HF meds optimized. --> early February  2017  2.CAD- On LHC 07/27/2015  As well as CT of chest   Mid Cx lesion, 100% stenosed. Right to left collaterals fill a large OM2.  Mid RCA to Dist RCA lesion, 30% stenosed.  Ost 2nd Diag lesion, 70% stenosed. Mild disease in the LAD. No chest pain. On aspirin and statin. No bb for now with concern for low output and hypotension.  3. Former [place] on smoking cessation.  4. Hyperlipidemia- on statin 5. Pulmonary Nodule- Noted on CT of chest 2013 and  2016 with no change in nodule.   6. COPD/Empysema - on CT of chest 07/29/2015  7. CEA- S/P R CEA 2009 8. Mild AS on ECHO.   Follow up in 3 weeks with Dr Donnamae Jude NP-C

## 2015-09-15 ENCOUNTER — Telehealth (HOSPITAL_COMMUNITY): Payer: Self-pay | Admitting: Pharmacist

## 2015-09-15 NOTE — Telephone Encounter (Signed)
Corlanor PA approved by [facility] through 09/14/16.  Ruta Hinds. Velva Harman, PharmD, BCPS, CPP Clinical Pharmacist Pager: [phone] Phone: [phone] 09/15/2015 2:27 PM

## 2015-09-23 ENCOUNTER — Telehealth (HOSPITAL_COMMUNITY): Payer: Self-pay | Admitting: *Deleted

## 2015-09-23 ENCOUNTER — Encounter (HOSPITAL_COMMUNITY)
Admission: RE | Admit: 2015-09-23 | Discharge: 2015-09-23 | Disposition: A | Discharge status: Home / Self Care | Payer: 59 | Source: Ambulatory Visit | Attending: Internal Medicine | Admitting: Internal Medicine

## 2015-09-23 NOTE — Progress Notes (Signed)
Cardiac Rehab Medication Review by a Pharmacist  Does the patient  feel that his/her medications are working for him/her?  yes  Has the patient been experiencing any side effects to the medications prescribed?  Yes occasional dizziness. Usually happens under exertion   Does the patient measure his/her own blood pressure or blood glucose at home?  yes BP normally running in 80-90s for SBP  Does the patient have any problems obtaining medications due to transportation or finances?   no  Understanding of regimen: good Understanding of indications: good Potential of compliance: good    Pharmacist comments: Pt stated that has completed corlanor samples and that the medication was on order to be picked up today at [facility].  Pt states BP have improved with corlanor until he ran out of the samples he was provided.  Pt states that he does experience occasional dizziness on exertion which goes away when he sits down to rest.  Pt demonstrated no cost or adherence barriers.     Jordan Johnston 09/23/2015 9:03 AM

## 2015-09-23 NOTE — Telephone Encounter (Signed)
Patient returned call from message left on his mobile phone.  Pt indicated that he had not contacted his insurance [place] for benefits for cardiac rehab.  [employer] called on his behalf.  Benefit information provided based upon cpt code [phone].  Pt has 25.00 co pay per visit with 20 visit limitation. Pt hesitant due to the out of pocket cost.  Pt to think about it and then decide if he wishes to pursue participation in rehab.  Pt made aware that 2 x week for a shorter period of time is permissible. Cherre Huger, BSN

## 2015-09-29 ENCOUNTER — Telehealth (HOSPITAL_COMMUNITY): Payer: Self-pay | Admitting: *Deleted

## 2015-09-29 ENCOUNTER — Encounter (HOSPITAL_COMMUNITY)
Admission: RE | Admit: 2015-09-29 | Discharge: 2015-09-29 | Disposition: A | Discharge status: Home / Self Care | Payer: 59 | Source: Ambulatory Visit | Attending: Internal Medicine | Admitting: Internal Medicine

## 2015-09-29 DIAGNOSIS — I509 Heart failure, unspecified: Secondary | ICD-10-CM | POA: Insufficient documentation

## 2015-09-29 NOTE — Telephone Encounter (Signed)
Received mess from cardiac rehab regarding pt's low BP this am, as per previous order by Dr Haroldine Laws pt can exercise if SBP is greater than 80 however they could not get it that high today, pt was asymptomatic.  Dr Haroldine Laws aware, he states no changes now, if continues to run low may need to stop Arlyce Harman.  Called cardiac rehab, they are aware and will see pt again on Fri and let us know if BP is below 80

## 2015-09-29 NOTE — Progress Notes (Signed)
Made several attempts to call the heart failure clinic. Pt pressured to get to work by 51.  Pt remained asymptomatic.  Exit bp 78/45.  Message left for triage nurse for call back, medication adjustment and earlier follow up in the clinic.  Contact information for pt given.  Pt ok to drive himself to work. Cherre Huger, BSN

## 2015-09-29 NOTE — Progress Notes (Signed)
Received immediate return call from Jordan Johnston.  Reviewed this mornings bp. Pt is on the lowest dose of his medications and his next follow up is January 13th.  Asked rehab staff to call Heart Failure clinic when they open for medication adjustment and follow up. Recheck bp 75/42. Asymptomatic. Cherre Huger, BSN

## 2015-09-29 NOTE — Progress Notes (Addendum)
Pt in today for his first day of exercise in cardiac rehab.  Pt pre exercise bp difficult to hear manual.  Pt auto bp 71/48.  Pt given gatorade to drink.  First recheck 64/35.  Pt given additional H20 to drink and feet elevated.  Recheck 80/46 sitting, bp 76/45 standing. Pt denies any symptoms.  On call for heart failure clinic paged by answering service. Cherre Huger, BSN

## 2015-10-01 ENCOUNTER — Telehealth (HOSPITAL_COMMUNITY): Payer: Self-pay | Admitting: *Deleted

## 2015-10-01 ENCOUNTER — Encounter (HOSPITAL_COMMUNITY)
Admission: RE | Admit: 2015-10-01 | Discharge: 2015-10-01 | Disposition: A | Discharge status: Home / Self Care | Payer: 59 | Source: Ambulatory Visit | Attending: Internal Medicine | Admitting: Internal Medicine

## 2015-10-01 DIAGNOSIS — I509 Heart failure, unspecified: Secondary | ICD-10-CM | POA: Diagnosis present

## 2015-10-01 NOTE — Progress Notes (Signed)
Pt returned today for his first day in the 6:45 class  cardiac rehab Phase II program.  Pt tolerated light exercise without difficulty. VSS with low beginning bp today.  Pre exercise 93/46 auto bp.  Pt drank extra water today with his breakfast.  Throughout exercise pt bp began to drop to the 80's.  Pt given a cup of gatorade to drink.  Post exercise bp  77/41.Pt given additional gatorade to drink. Recheck 75/41.  Pt given h20 and feet elevated.  Recheck orthostatic bp sitting 75/41 and 70/39 standing  Recheck 67/36.  These are all auto bp unable to hear manual bp.  Heart failure clinic called and spoke to Ada. Nira Conn will confer with with Dr. Haroldine Laws regarding medication changes.  Pt remained asymptomatic and feels ok to drive home.  Pt knows to expect a call later today with instructions.  Advised pt to take his medications after he completes exercise.  This is more likely the time of day he takes his medications on no rehab days. Telemetry- SR with st depression, asymptomatic.  Medication list reconciled.  Pt verbalized compliance with medications and denies barriers to compliance. PSYCHOSOCIAL ASSESSMENT:  PHQ-0. Pt exhibits positive coping skills, hopeful outlook with supportive family.  Pt lives with his brother and his brothers wife.  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 No psychosocial needs identified at this time, no psychosocial interventions necessary.    Pt enjoys working on and restoring cars.   Pt cardiac rehab  goal is  to get more active , increase his strength and stamina.  Pt encouraged to participate in home exerciseto increase ability to achieve these goals. Will encourage and give pt handouts on the education classes and attend as he is able to do so due to having to be at work by 9.   Pt long term cardiac rehab goal is be able to do activities of daily living.  Pt helps around the house with light chores. Advised pt of his insurance benefits.  Pt has 25.00  copay and 20 visit limitation.  Pt oriented to exercise equipment and routine.  Understanding verbalized. Cherre Huger, BSN

## 2015-10-01 NOTE — Telephone Encounter (Signed)
stop spironolactone per DB (bp dropping into 80's)  Left detailed msg on mobile number for pt to stop medication keep track of bp and call us Monday to see if that helped. Updated med list

## 2015-10-04 ENCOUNTER — Encounter (HOSPITAL_COMMUNITY): Admission: RE | Admit: 2015-10-04 | Payer: 59 | Source: Ambulatory Visit

## 2015-10-06 ENCOUNTER — Encounter (HOSPITAL_COMMUNITY)
Admission: RE | Admit: 2015-10-06 | Discharge: 2015-10-06 | Disposition: A | Discharge status: Home / Self Care | Payer: 59 | Source: Ambulatory Visit | Attending: Internal Medicine | Admitting: Internal Medicine

## 2015-10-06 DIAGNOSIS — I509 Heart failure, unspecified: Secondary | ICD-10-CM | POA: Diagnosis not present

## 2015-10-06 NOTE — Progress Notes (Signed)
QUALITY OF LIFE SCORE REVIEW  Pt completed Quality of Life survey as a participant in Cardiac Rehab. Scores 21.0 or below are considered low. Pt scored the following Overall 29.82, Health and Function 29.57, socioeconomic 30.00, physiological and spiritual 30.00, family 30.00. Pt scored well above the low threshold score of 21.  Pt demonstrates positive and healthy outlook on life.  Pt with support of family which includes his brother.  Will continue to monitor and intervene as necessary.  Cherre Huger, BSN

## 2015-10-08 ENCOUNTER — Encounter (HOSPITAL_COMMUNITY)
Admission: RE | Admit: 2015-10-08 | Discharge: 2015-10-08 | Disposition: A | Discharge status: Home / Self Care | Payer: 59 | Source: Ambulatory Visit | Attending: Internal Medicine | Admitting: Internal Medicine

## 2015-10-08 ENCOUNTER — Ambulatory Visit (HOSPITAL_COMMUNITY)
Admission: RE | Admit: 2015-10-08 | Discharge: 2015-10-08 | Disposition: A | Discharge status: Home / Self Care | Payer: 59 | Source: Ambulatory Visit | Attending: Internal Medicine | Admitting: Internal Medicine

## 2015-10-08 VITALS — BP 70/42 | HR 93 | Wt 155.4 lb

## 2015-10-08 DIAGNOSIS — I2510 Atherosclerotic heart disease of native coronary artery without angina pectoris: Secondary | ICD-10-CM | POA: Diagnosis not present

## 2015-10-08 DIAGNOSIS — I350 Nonrheumatic aortic (valve) stenosis: Secondary | ICD-10-CM | POA: Diagnosis not present

## 2015-10-08 DIAGNOSIS — I5022 Chronic systolic (congestive) heart failure: Secondary | ICD-10-CM | POA: Diagnosis not present

## 2015-10-08 DIAGNOSIS — J449 Chronic obstructive pulmonary disease, unspecified: Secondary | ICD-10-CM | POA: Insufficient documentation

## 2015-10-08 DIAGNOSIS — R911 Solitary pulmonary nodule: Secondary | ICD-10-CM | POA: Diagnosis not present

## 2015-10-08 DIAGNOSIS — E785 Hyperlipidemia, unspecified: Secondary | ICD-10-CM | POA: Insufficient documentation

## 2015-10-08 DIAGNOSIS — I509 Heart failure, unspecified: Secondary | ICD-10-CM | POA: Diagnosis not present

## 2015-10-08 DIAGNOSIS — Z87891 Personal history of nicotine dependence: Secondary | ICD-10-CM | POA: Insufficient documentation

## 2015-10-08 MED ORDER — IVABRADINE HCL 5 MG PO TABS: 5.0000 mg | ORAL_TABLET | Freq: Two times a day (BID) | ORAL | Status: DC

## 2015-10-08 NOTE — Progress Notes (Signed)
Patient ID: Jordan Johnston, male   DOB: 07/26/1951, 65 y.o.   MRN: [id number]    Advanced Heart Failure Clinic Note   [id number] Primary HF Cardiologist: Dr Haroldine Laws Primary Cardiologist: Dr Gwenlyn Found  Vascular : Dr Gwenlyn Found   HPI: Jordan Johnston is a 65 y.o. male with a history of testicular cancer, HTN, HLD, carotid stenosis s/p R CEA (2009), former tobacco abuse, LV dysfunction EF 10-15% 07/2015, CAD with known 100% LCx occlusion, mild AS/ mild MR, and chronic systolic heart failure.   Admitted 07/26/2015 with increase dyspnea and leg edema. Prior to admit he went to urgent care ~ 1 month ago and was diagnosed with CAP-->placed on Abx. He continued to decline. Take to cath lab for RHC/LHC as noted below. Diuresed with IV lasix and transitioned to 20 mg po lasix daily. He was not placed on bb due to hypotension. Discharge weight was 148 pounds.   He returns for regular follow up. Previously increase corlanor 2.5 mg BID due to hypotension and elevated heart rate. Weight at home 152 this morning.  Took a dose of lasix a few weeks ago, otherwise not taking. No SOB/PND/Orthonea/CP. Gets lightheaded with standing quickly. No bendopnea.  No near syncope. Still working 8-10 hours per day. Appetite continues to improve. Continues to abstain from smoking. Drinks between 1-2L a day, doesn't think he ever goes over, watches salt intake. He is holding meds mornings of CR, and taking them after, which seems to help.   RHC/LHC 07/27/2015 PCWP 20  PVR 2.7 CO/CI 4.7 CI 2.43  -Mid CX 100% stenosed R to L collaterals. Mid RCA 30%. Ost70% stenosed. Mild disease in the LAD. Medically managed.   ECHO 07/27/2015: EF 10-15%. With mild AS. RV mod dysfunction.   Labs 08/10/2015: K 4.3 Creatinine 1.06 Dig level 2.5  Labs 08/01/2015: K 4.6 Creatinine 1.07  Labs 08/10/2015: K 4.3 Creatinine 1.06   FH: Brother CAD SH: Lives with brother. Former smoker. Does not drink alcohol.    ROS: All systems negative except as listed in  HPI, PMH and Problem List.  SH:  Social History   Social History  . Marital Status: Single    Spouse Name: N/A  . Number of Children: N/A  . Years of Education: N/A   Occupational History  . Not on file.   Social History Main Topics  . Smoking status: Former Smoker -- 1.00 packs/day for 40 years    Types: Cigarettes    Quit date: 05/27/2015  . Smokeless tobacco: Never Used  . Alcohol Use: No  . Drug Use: No  . Sexual Activity: Not on file   Other Topics Concern  . Not on file   Social History Narrative    FH:  Family History  Problem Relation Age of Onset  . Diabetes Mother   . Alzheimer's disease Mother   . Coronary artery disease Father   . Melanoma Father     Past Medical History  Diagnosis Date  . History of testicular cancer     Remote, 30 yrs +  . Hernia   . History of CEA (carotid endarterectomy)     Right-sided in 2009  . HTN (hypertension)   . HLD (hyperlipidemia)   . CAD (coronary artery disease)   . CHF (congestive heart failure) ([place])   . Shortness of breath dyspnea     Current Outpatient Prescriptions  Medication Sig Dispense Refill  . aspirin 81 MG tablet Take 81 mg by mouth daily.    Marland Kitchen  atorvastatin (LIPITOR) 80 MG tablet Take 1 tablet (80 mg total) by mouth daily at 6 PM. 30 tablet 11  . furosemide (LASIX) 20 MG tablet Take one tablet EVERY Tuesday and Saturday if your weight is over 155 bs 15 tablet 11  . ivabradine (CORLANOR) 5 MG TABS tablet Take 0.5 tablets (2.5 mg total) by mouth 2 (two) times daily with a meal. 60 tablet 2  . losartan (COZAAR) 25 MG tablet Take 0.5 tablets (12.5 mg total) by mouth daily. 30 tablet 11   No current facility-administered medications for this encounter.    Filed Vitals:   10/08/15 1039  BP: 70/42  Pulse: 93  Weight: 155 lb 6.4 oz (70.489 kg)  SpO2: 96%   BP 88/54 on recheck.   PHYSICAL EXAM:  General:  Ambulated into clinic without difficult. No resp difficulty. Brother present.  HEENT:  normal x poor dentition Neck: supple. JVP flat. Carotids 2+ bilaterally; no bruits. No thyromegaly or nodule noted. Cor: PMI laterally displaced. RRR. No rubs or murmurs appreciated. Minimal S3, improved from previous. Lungs: CTAB, normal effort. Abdomen: soft, NT, ND, no HSM. No bruits or masses. +BS Extremities: no cyanosis, clubbing, rash, edema Neuro: alert & orientedx3, cranial nerves grossly intact. Moves all 4 extremities w/o difficulty. Affect pleasant.  ASSESSMENT & PLAN: 1. 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 ECHO 07/27/2015 EF 10-15%.  Diuresed and started on HF meds. Have had troubles with med titration 2/2 hypotension.  NYHA  II. Volume status stable on exam.  Seems to be doing ADLs and work without difficulty. Doing well in cardiac rehab, apart from low BPs.  - Continue to only take lasix as needed for weight > 155 pounds.  - Continue losartan 12.5 mg daily and 12.5 mg spiro daily.  - Continue to hold off on BB with hypotension.  - Increase corlanor to 5 mg BID. - Check ECHO at next visit (4 weeks)   - Consider for CPX once we have his BP more stable.   2.CAD- On LHC 07/27/2015  As well as CT of chest   Mid Cx lesion, 100% stenosed. Right to left collaterals fill a large OM2.  Mid RCA to Dist RCA lesion, 30% stenosed.  Ost 2nd Diag lesion, 70% stenosed. Mild disease in the LAD. Denies any CP. Continue aspirin 81 mg daily and statin.  - No BB with hypotension and some concerns for low output HF.  3. Former [place] on smoking cessation.  4. Hyperlipidemia-  - Continue atorvastatin 80 mg daily.  5. Pulmonary Nodule - No change between CT chest 2013 and 2016  6. COPD/Empysema  - on CT of chest 07/29/2015  - Stable.  7. CEA- S/P R CEA 2009 8. Mild AS on ECHO.  - Recheck echo next visit as above.   Follow up in 3-4 weeks with Echo.   Shirley Friar PA-C  Patient seen and examined with Oda Kilts, PA-C. We discussed all aspects of the encounter. I agree with the assessment and plan as stated above.   He is doing well despite severe LV dysfunction. BP low at times but I checked it manually today and SBP > 100. His tachycardia has resolved and his heart exam sounds much better. He is tolerating cardiac rehab and getting stronger. Volume status looks ok. Agree with titrating Corlanor today. Hopefully we will be able to increase losartan as well in  the near future. CAD is stable without ischemia. Will repeat echo at next visit to see if any LV recovery has occurred.   Braheem Tomasik,MD 10:01 PM

## 2015-10-08 NOTE — Patient Instructions (Signed)
Increase Corlanor to 5 mg Twice daily   Your physician recommends that you schedule a follow-up appointment in: 3-4 weeks with echocardiogram

## 2015-10-08 NOTE — Progress Notes (Signed)
Advanced Heart Failure Medication Review by a Pharmacist  Does the patient  feel that his/her medications are working for him/her?  yes  Has the patient been experiencing any side effects to the medications prescribed?  no  Does the patient measure his/her own blood pressure or blood glucose at home?  no   Does the patient have any problems obtaining medications due to transportation or finances?   no  Understanding of regimen: good Understanding of indications: good Potential of compliance: good Patient understands to avoid NSAIDs. Patient understands to avoid decongestants.  Issues to address at subsequent visits: None   Pharmacist comments:  Jordan Johnston is a pleasant 65 yo M presenting with his son but without a medication list. He seems to have a good understanding of his regimen and reports good compliance with his medications. He did not have any specific medication-related questions or concerns for me at this time.   Ruta Hinds. Velva Harman, PharmD, BCPS, CPP Clinical Pharmacist Pager: [phone] Phone: [phone] 10/08/2015 10:59 AM      Time with patient: 6 minutes Preparation and documentation time: 2 minutes Total time: 8 minutes

## 2015-10-11 ENCOUNTER — Encounter (HOSPITAL_COMMUNITY)
Admission: RE | Admit: 2015-10-11 | Discharge: 2015-10-11 | Disposition: A | Discharge status: Home / Self Care | Payer: 59 | Source: Ambulatory Visit | Attending: Internal Medicine | Admitting: Internal Medicine

## 2015-10-11 DIAGNOSIS — I509 Heart failure, unspecified: Secondary | ICD-10-CM | POA: Diagnosis not present

## 2015-10-13 ENCOUNTER — Encounter (HOSPITAL_COMMUNITY)
Admission: RE | Admit: 2015-10-13 | Discharge: 2015-10-13 | Disposition: A | Discharge status: Home / Self Care | Payer: 59 | Source: Ambulatory Visit | Attending: Internal Medicine | Admitting: Internal Medicine

## 2015-10-13 DIAGNOSIS — I509 Heart failure, unspecified: Secondary | ICD-10-CM | POA: Diagnosis not present

## 2015-10-13 NOTE — Progress Notes (Signed)
Reviewed home exercise with pt today.  Pt plans to walk for exercise.  Reviewed THR, pulse, RPE, sign and symptoms, and when to call 911 or MD.  Pt voiced understanding.    Giannie Soliday,MS, ACSM RCEP  

## 2015-10-15 ENCOUNTER — Encounter (HOSPITAL_COMMUNITY)
Admission: RE | Admit: 2015-10-15 | Discharge: 2015-10-15 | Disposition: A | Discharge status: Home / Self Care | Payer: 59 | Source: Ambulatory Visit | Attending: Internal Medicine | Admitting: Internal Medicine

## 2015-10-15 DIAGNOSIS — I509 Heart failure, unspecified: Secondary | ICD-10-CM | POA: Diagnosis not present

## 2015-10-18 ENCOUNTER — Encounter (HOSPITAL_COMMUNITY)
Admission: RE | Admit: 2015-10-18 | Discharge: 2015-10-18 | Disposition: A | Discharge status: Home / Self Care | Payer: 59 | Source: Ambulatory Visit | Attending: Internal Medicine | Admitting: Internal Medicine

## 2015-10-18 DIAGNOSIS — I509 Heart failure, unspecified: Secondary | ICD-10-CM | POA: Diagnosis not present

## 2015-10-20 ENCOUNTER — Encounter (HOSPITAL_COMMUNITY)
Admission: RE | Admit: 2015-10-20 | Discharge: 2015-10-20 | Disposition: A | Discharge status: Home / Self Care | Payer: 59 | Source: Ambulatory Visit | Attending: Internal Medicine | Admitting: Internal Medicine

## 2015-10-20 DIAGNOSIS — I509 Heart failure, unspecified: Secondary | ICD-10-CM | POA: Diagnosis not present

## 2015-10-20 NOTE — Progress Notes (Signed)
Jordan Johnston 65 y.o. male Nutrition Note Spoke with pt. Nutrition Plan and Nutrition Survey goals reviewed with pt. Pt is following Step 2 of the Therapeutic Lifestyle Changes diet. Pt is pre-diabetic according to his last A1c. Pt reports he was unaware that he has pre-diabetes. Pre-diabetes discussed. Pt states he does not currently have a PCP, but is planning on getting one. Pt with dx of CHF. Per discussion, pt does not use canned/convenience foods often. Pt rarely adds salt to food. Pt does eats for lunch most days of the week. Sodium content of foods when eating out discussed. Pt expressed understanding of the information reviewed. Pt aware of nutrition education classes offered and is unable to attend nutrition classes due to work.  Lab Results  Component Value Date   HGBA1C 6.1* 07/26/2015    Nutrition Diagnosis ? Food-and nutrition-related knowledge deficit related to lack of exposure to information as related to diagnosis of: ? CVD ? Pre-DM  Nutrition RX/ Estimated Daily Nutrition Needs for: wt  maintenance  2050-2350 Kcal, 65-75 gm fat, 13-15 gm sat fat, 2.0-2.3 gm trans-fat, <1500 mg sodium, 250 gm CHO   Nutrition Intervention ? Pt's individual nutrition plan reviewed with pt. ? Benefits of adopting Therapeutic Lifestyle Changes discussed when Medficts reviewed. ? Pt to attend the Portion Distortion class ? Pt to attend the Diabetes Q & A class  ? Pt given handouts for: ? Nutrition I class ? Nutrition II class ? pre-diabetes ? Continue client-centered nutrition education by RD, as part of interdisciplinary care. Goal(s) ? Pt to identify and limit food sources of sodium ? Pt to describe the benefit of including fruits, vegetables, whole grains, and low-fat dairy products in a heart healthy meal plan. Monitor and Evaluate progress toward nutrition goal with team. Nutrition Risk: Change to Moderate Derek Mound, M.Ed, RD, LDN, CDE 10/20/2015 7:56 AM

## 2015-10-22 ENCOUNTER — Encounter (HOSPITAL_COMMUNITY)
Admission: RE | Admit: 2015-10-22 | Discharge: 2015-10-22 | Disposition: A | Discharge status: Home / Self Care | Payer: 59 | Source: Ambulatory Visit | Attending: Internal Medicine | Admitting: Internal Medicine

## 2015-10-22 DIAGNOSIS — I509 Heart failure, unspecified: Secondary | ICD-10-CM | POA: Diagnosis not present

## 2015-10-22 NOTE — Progress Notes (Signed)
Pt weight elevated today at cardiac rehab.  Pt weight was 73.0 kg which is 160.6 pounds.  Pt noted that his scales at weight also showed weight gain.  Pt remarked "I probably should have taken the lasix yesterday". In looking back at his weights. Pt is trending up.  Lungs clear bilat, no shortness of breath. O2 sat 98%.  Advised pt to take his prn Lasix today, monitor weight and take again on tomorrow if weight remains elevated over 155 pounds per his instructions for the prn lasix.  Pt verbalized understanding. Will send rehab report with weight listed to heart failure clinic for review.Cherre Huger, BSN

## 2015-10-25 ENCOUNTER — Encounter (HOSPITAL_COMMUNITY)
Admission: RE | Admit: 2015-10-25 | Discharge: 2015-10-25 | Disposition: A | Discharge status: Home / Self Care | Payer: 59 | Source: Ambulatory Visit | Attending: Internal Medicine | Admitting: Internal Medicine

## 2015-10-25 DIAGNOSIS — I509 Heart failure, unspecified: Secondary | ICD-10-CM | POA: Diagnosis not present

## 2015-10-27 ENCOUNTER — Encounter (HOSPITAL_COMMUNITY)
Admission: RE | Admit: 2015-10-27 | Discharge: 2015-10-27 | Disposition: A | Discharge status: Home / Self Care | Payer: 59 | Source: Ambulatory Visit | Attending: Internal Medicine | Admitting: Internal Medicine

## 2015-10-27 DIAGNOSIS — I509 Heart failure, unspecified: Secondary | ICD-10-CM | POA: Diagnosis not present

## 2015-10-29 ENCOUNTER — Encounter (HOSPITAL_COMMUNITY)
Admission: RE | Admit: 2015-10-29 | Discharge: 2015-10-29 | Disposition: A | Discharge status: Home / Self Care | Payer: 59 | Source: Ambulatory Visit | Attending: Internal Medicine | Admitting: Internal Medicine

## 2015-10-29 DIAGNOSIS — I509 Heart failure, unspecified: Secondary | ICD-10-CM | POA: Diagnosis not present

## 2015-11-01 ENCOUNTER — Encounter (HOSPITAL_COMMUNITY)
Admission: RE | Admit: 2015-11-01 | Discharge: 2015-11-01 | Disposition: A | Discharge status: Home / Self Care | Payer: 59 | Source: Ambulatory Visit | Attending: Internal Medicine | Admitting: Internal Medicine

## 2015-11-01 DIAGNOSIS — I509 Heart failure, unspecified: Secondary | ICD-10-CM | POA: Diagnosis not present

## 2015-11-03 ENCOUNTER — Encounter: Payer: Self-pay | Admitting: Internal Medicine

## 2015-11-03 ENCOUNTER — Encounter (HOSPITAL_COMMUNITY)
Admission: RE | Admit: 2015-11-03 | Discharge: 2015-11-03 | Disposition: A | Discharge status: Home / Self Care | Payer: 59 | Source: Ambulatory Visit | Attending: Internal Medicine | Admitting: Internal Medicine

## 2015-11-03 DIAGNOSIS — I509 Heart failure, unspecified: Secondary | ICD-10-CM | POA: Diagnosis not present

## 2015-11-05 ENCOUNTER — Encounter (HOSPITAL_COMMUNITY)
Admission: RE | Admit: 2015-11-05 | Discharge: 2015-11-05 | Disposition: A | Discharge status: Home / Self Care | Payer: 59 | Source: Ambulatory Visit | Attending: Internal Medicine | Admitting: Internal Medicine

## 2015-11-05 DIAGNOSIS — I509 Heart failure, unspecified: Secondary | ICD-10-CM | POA: Diagnosis not present

## 2015-11-08 ENCOUNTER — Ambulatory Visit (HOSPITAL_COMMUNITY)
Admission: RE | Admit: 2015-11-08 | Discharge: 2015-11-08 | Disposition: A | Discharge status: Home / Self Care | Payer: 59 | Source: Ambulatory Visit | Attending: Internal Medicine | Admitting: Internal Medicine

## 2015-11-08 ENCOUNTER — Encounter (HOSPITAL_COMMUNITY): Payer: Self-pay | Admitting: Internal Medicine

## 2015-11-08 ENCOUNTER — Ambulatory Visit (HOSPITAL_BASED_OUTPATIENT_CLINIC_OR_DEPARTMENT_OTHER)
Admission: RE | Admit: 2015-11-08 | Discharge: 2015-11-08 | Disposition: A | Discharge status: Home / Self Care | Payer: 59 | Source: Ambulatory Visit | Attending: Internal Medicine | Admitting: Internal Medicine

## 2015-11-08 ENCOUNTER — Encounter (HOSPITAL_COMMUNITY)
Admission: RE | Admit: 2015-11-08 | Discharge: 2015-11-08 | Disposition: A | Discharge status: Home / Self Care | Payer: 59 | Source: Ambulatory Visit | Attending: Internal Medicine | Admitting: Internal Medicine

## 2015-11-08 VITALS — BP 108/64 | HR 64 | Wt 161.2 lb

## 2015-11-08 DIAGNOSIS — E785 Hyperlipidemia, unspecified: Secondary | ICD-10-CM | POA: Diagnosis not present

## 2015-11-08 DIAGNOSIS — I350 Nonrheumatic aortic (valve) stenosis: Secondary | ICD-10-CM | POA: Diagnosis not present

## 2015-11-08 DIAGNOSIS — I509 Heart failure, unspecified: Secondary | ICD-10-CM | POA: Diagnosis not present

## 2015-11-08 DIAGNOSIS — I340 Nonrheumatic mitral (valve) insufficiency: Secondary | ICD-10-CM | POA: Insufficient documentation

## 2015-11-08 DIAGNOSIS — I352 Nonrheumatic aortic (valve) stenosis with insufficiency: Secondary | ICD-10-CM | POA: Insufficient documentation

## 2015-11-08 DIAGNOSIS — I5189 Other ill-defined heart diseases: Secondary | ICD-10-CM | POA: Diagnosis not present

## 2015-11-08 DIAGNOSIS — I517 Cardiomegaly: Secondary | ICD-10-CM | POA: Insufficient documentation

## 2015-11-08 NOTE — Progress Notes (Signed)
  Echocardiogram 2D Echocardiogram has been performed.  Bobbye Charleston 11/08/2015, 11:13 AM

## 2015-11-08 NOTE — Progress Notes (Signed)
Patient ID: CLINE GRAVATT, male   DOB: 06/07/1951, 65 y.o.   MRN: [id number]    Advanced Heart Failure Clinic Note   [id number] Primary HF Cardiologist: Dr Haroldine Laws Primary Cardiologist: Dr Gwenlyn Found  Vascular : Dr Gwenlyn Found   HPI: Jordan Johnston is a 65 y.o. male with a history of testicular cancer, HTN, HLD, carotid stenosis s/p R CEA (2009), former tobacco abuse, LV dysfunction EF 10-15% 07/2015, CAD with known 100% LCx occlusion, mild AS/ mild MR, and chronic systolic heart failure.   Admitted 07/26/2015 with increase dyspnea and leg edema. Prior to admit he went to urgent care ~ 1 month ago and was diagnosed with CAP-->placed on Abx. He continued to decline. Take to cath lab for RHC/LHC as noted below. Diuresed with IV lasix and transitioned to 20 mg po lasix daily. He was not placed on bb due to hypotension. Discharge weight was 148 pounds.   He returns for regular follow up. Overall feels good. Denies SOB/PND/Orthopnea/CP.  Weight at cardiac rehab 160 pounds. He is not weighing at home. Appetite.  Followed at cardiac rehab. Taking all medications.   RHC/LHC 07/27/2015 PCWP 20  PVR 2.7 CO/CI 4.7 CI 2.43  -Mid CX 100% stenosed R to L collaterals. Mid RCA 30%. Ost70% stenosed. Mild disease in the LAD. Medically managed.   ECHO 07/27/2015: EF 10-15%. With mild AS. RV mod dysfunction.   Labs 08/10/2015: K 4.3 Creatinine 1.06 Dig level 2.5  Labs 08/01/2015: K 4.6 Creatinine 1.07  Labs 08/10/2015: K 4.3 Creatinine 1.06   FH: Brother CAD SH: Lives with brother. Former smoker. Does not drink alcohol.    ROS: All systems negative except as listed in HPI, PMH and Problem List.  SH:  Social History   Social History  . Marital Status: Single    Spouse Name: N/A  . Number of Children: N/A  . Years of Education: N/A   Occupational History  . Not on file.   Social History Main Topics  . Smoking status: Former Smoker -- 1.00 packs/day for 40 years    Types: Cigarettes    Quit date:  05/27/2015  . Smokeless tobacco: Never Used  . Alcohol Use: No  . Drug Use: No  . Sexual Activity: Not on file   Other Topics Concern  . Not on file   Social History Narrative    FH:  Family History  Problem Relation Age of Onset  . Diabetes Mother   . Alzheimer's disease Mother   . Coronary artery disease Father   . Melanoma Father     Past Medical History  Diagnosis Date  . History of testicular cancer     Remote, 30 yrs +  . Hernia   . History of CEA (carotid endarterectomy)     Right-sided in 2009  . HTN (hypertension)   . HLD (hyperlipidemia)   . CAD (coronary artery disease)   . CHF (congestive heart failure) ([place])   . Shortness of breath dyspnea     Current Outpatient Prescriptions  Medication Sig Dispense Refill  . aspirin 81 MG tablet Take 81 mg by mouth daily.    Marland Kitchen atorvastatin (LIPITOR) 80 MG tablet Take 1 tablet (80 mg total) by mouth daily at 6 PM. 30 tablet 11  . ivabradine (CORLANOR) 5 MG TABS tablet Take 1 tablet (5 mg total) by mouth 2 (two) times daily with a meal. 60 tablet 2  . losartan (COZAAR) 25 MG tablet Take 0.5 tablets (12.5 mg total) by mouth  daily. 30 tablet 11   No current facility-administered medications for this encounter.    Filed Vitals:   11/08/15 1138  BP: 108/64  Pulse: 64  Weight: 161 lb 4 oz (73.143 kg)  SpO2: 100%     PHYSICAL EXAM:  General:  Ambulated into clinic without difficult. No resp difficulty. Brother present.  HEENT: normal x poor dentition Neck: supple. JVP flat. Carotids 2+ bilaterally; no bruits. No thyromegaly or nodule noted. Cor: PMI laterally displaced. RRR. No rubs or murmurs appreciated. Minimal S3, improved from previous. AS  Lungs: CTAB, normal effort. Abdomen: soft, NT, ND, no HSM. No bruits or masses. +BS Extremities: no cyanosis, clubbing, rash, edema Neuro: alert & orientedx3, cranial nerves grossly intact. Moves all 4 extremities w/o difficulty. Affect pleasant.  ASSESSMENT & PLAN: 1.  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 ECHO 07/27/2015 EF 10-15%.  Diuresed and started on HF meds. Have had troubles with med titration 2/2 hypotension.  NYHA  II. Volume status stable on exam.  Doing well in cardiac rehab - Todays ECHO discussed and reviewed by Dr Haroldine Laws. EF 25-30% with ? Low-gradient AS.  - Continue to only take lasix as needed for weight > 155 pounds. He has not been taking.  - Continue losartan 12.5 mg daily and 12.5 mg spiro daily.  - Continue to hold off on BB with hypotension.  - Continue corlanor to 5 mg BID.Heart rate 64  - Refer for dobutamine echo to evaluate for low-gradient AS 2.CAD- On LHC 07/27/2015  As well as CT of chest   Mid Cx lesion, 100% stenosed. Right to left collaterals fill a large OM2.  Mid RCA to Dist RCA lesion, 30% stenosed.  Ost 2nd Diag lesion, 70% stenosed. Mild disease in the LAD. Denies any CP. Continue aspirin 81 mg daily and statin.  - No BB with hypotension and some concerns for low output HF.  3. Former [place] on smoking cessation.  4. Hyperlipidemia-  - Continue atorvastatin 80 mg daily.  5. Pulmonary Nodule - No change between CT chest 2013 and 2016  6. COPD/Empysema  - on CT of chest 07/29/2015  - Stable.  7. CEA- S/P R CEA 2009 8. Aortic stenois - ECHO. Concern for low-gradient AS - Set up dobutamine ECHO   Follow up in 6 weeks.   Darrick Grinder NP-C  12:43 PM]  Patient seen and examined with Darrick Grinder, NP. We discussed all aspects of the encounter. I agree with the assessment and plan as stated above.   He is symptomatically improved. Echo reviewed personally today EF now up to 25-30%. However aortic valve is very restricted and I worry about low gradient AS (menad gradient on echo is 75mm HG). Will proceed with dobutamine echo. BP to titrate HF meds further. Volume status ok.   Bensimhon, Daniel,MD 8:23  PM

## 2015-11-08 NOTE — Patient Instructions (Signed)
Your physician has requested that you have a stress echocardiogram. For further information please visit [email or web address]. Please follow instruction sheet as given.  Your physician recommends that you schedule a follow-up appointment in: 6 weeks

## 2015-11-10 ENCOUNTER — Encounter (HOSPITAL_COMMUNITY)
Admission: RE | Admit: 2015-11-10 | Discharge: 2015-11-10 | Disposition: A | Discharge status: Home / Self Care | Payer: 59 | Source: Ambulatory Visit | Attending: Internal Medicine | Admitting: Internal Medicine

## 2015-11-10 DIAGNOSIS — I509 Heart failure, unspecified: Secondary | ICD-10-CM | POA: Diagnosis not present

## 2015-11-12 ENCOUNTER — Encounter (HOSPITAL_COMMUNITY)
Admission: RE | Admit: 2015-11-12 | Discharge: 2015-11-12 | Disposition: A | Discharge status: Home / Self Care | Payer: 59 | Source: Ambulatory Visit | Attending: Internal Medicine | Admitting: Internal Medicine

## 2015-11-12 DIAGNOSIS — I509 Heart failure, unspecified: Secondary | ICD-10-CM | POA: Diagnosis not present

## 2015-11-15 ENCOUNTER — Encounter (HOSPITAL_COMMUNITY)
Admission: RE | Admit: 2015-11-15 | Discharge: 2015-11-15 | Disposition: A | Discharge status: Home / Self Care | Payer: 59 | Source: Ambulatory Visit | Attending: Internal Medicine | Admitting: Internal Medicine

## 2015-11-15 DIAGNOSIS — I509 Heart failure, unspecified: Secondary | ICD-10-CM | POA: Diagnosis not present

## 2015-11-17 ENCOUNTER — Encounter (HOSPITAL_COMMUNITY)
Admission: RE | Admit: 2015-11-17 | Discharge: 2015-11-17 | Disposition: A | Discharge status: Home / Self Care | Payer: 59 | Source: Ambulatory Visit | Attending: Internal Medicine | Admitting: Internal Medicine

## 2015-11-17 DIAGNOSIS — I509 Heart failure, unspecified: Secondary | ICD-10-CM | POA: Diagnosis not present

## 2015-11-17 NOTE — Progress Notes (Signed)
Pt graduated from cardiac rehab program today with completion of 20 exercise sessions per insurance reimbursement limitations in Phase II. Pt maintained good attendance to exercise.  Due to his work schedule he was unable to stay for the lectures but did receive handouts.  Pt  progressed nicely during his participation in rehab as evidenced by increased MET level. Pt MET level increased from 2.6 to 3.5.  Medication list reconciled. Repeat  PHQ score- 0 .  Pt has made significant lifestyle changes and should be commended for his success. Pt feels he has achieved his goals during cardiac rehab. Pt feels he has increased his strength and stamina.  Pt has returned back to work and works 7 days a week.  Pt feels stronger and is able to do ADL's.  Pt plans to continue exercise with walking. Cherre Huger, BSN

## 2015-11-19 ENCOUNTER — Encounter (HOSPITAL_COMMUNITY): Payer: 59

## 2015-11-22 ENCOUNTER — Encounter (HOSPITAL_COMMUNITY): Payer: 59

## 2015-11-23 ENCOUNTER — Ambulatory Visit (HOSPITAL_COMMUNITY): Payer: 59 | Attending: Cardiovascular Disease

## 2015-11-23 ENCOUNTER — Ambulatory Visit (HOSPITAL_BASED_OUTPATIENT_CLINIC_OR_DEPARTMENT_OTHER): Payer: 59

## 2015-11-23 DIAGNOSIS — I359 Nonrheumatic aortic valve disorder, unspecified: Secondary | ICD-10-CM | POA: Diagnosis present

## 2015-11-23 DIAGNOSIS — I110 Hypertensive heart disease with heart failure: Secondary | ICD-10-CM | POA: Insufficient documentation

## 2015-11-23 DIAGNOSIS — I509 Heart failure, unspecified: Secondary | ICD-10-CM | POA: Diagnosis not present

## 2015-11-23 DIAGNOSIS — Z87891 Personal history of nicotine dependence: Secondary | ICD-10-CM | POA: Insufficient documentation

## 2015-11-23 DIAGNOSIS — I350 Nonrheumatic aortic (valve) stenosis: Secondary | ICD-10-CM | POA: Insufficient documentation

## 2015-11-23 DIAGNOSIS — R0989 Other specified symptoms and signs involving the circulatory and respiratory systems: Secondary | ICD-10-CM

## 2015-11-23 DIAGNOSIS — E785 Hyperlipidemia, unspecified: Secondary | ICD-10-CM | POA: Insufficient documentation

## 2015-11-23 MED ORDER — DOBUTAMINE INFUSION FOR EP/ECHO/NUC (1000 MCG/ML)
20.0000 ug/kg/min | Freq: Once | INTRAVENOUS | Status: AC
  Administered 2015-11-23: 20 ug/kg/min via INTRAVENOUS

## 2015-11-24 ENCOUNTER — Encounter (HOSPITAL_COMMUNITY): Payer: 59

## 2015-11-26 ENCOUNTER — Encounter (HOSPITAL_COMMUNITY): Payer: 59

## 2015-11-29 ENCOUNTER — Encounter (HOSPITAL_COMMUNITY): Payer: 59

## 2015-12-01 ENCOUNTER — Encounter (HOSPITAL_COMMUNITY): Payer: 59

## 2015-12-03 ENCOUNTER — Encounter (HOSPITAL_COMMUNITY): Payer: 59

## 2015-12-06 ENCOUNTER — Encounter (HOSPITAL_COMMUNITY): Payer: 59

## 2015-12-08 ENCOUNTER — Encounter (HOSPITAL_COMMUNITY): Payer: 59

## 2015-12-10 ENCOUNTER — Encounter (HOSPITAL_COMMUNITY): Payer: 59

## 2015-12-13 ENCOUNTER — Encounter (HOSPITAL_COMMUNITY): Payer: 59

## 2015-12-15 ENCOUNTER — Encounter (HOSPITAL_COMMUNITY): Payer: 59

## 2015-12-17 ENCOUNTER — Encounter (HOSPITAL_COMMUNITY): Payer: 59

## 2015-12-20 ENCOUNTER — Encounter (HOSPITAL_COMMUNITY): Payer: 59

## 2015-12-21 ENCOUNTER — Ambulatory Visit (HOSPITAL_COMMUNITY)
Admission: RE | Admit: 2015-12-21 | Discharge: 2015-12-21 | Disposition: A | Discharge status: Home / Self Care | Payer: 59 | Source: Ambulatory Visit | Attending: Internal Medicine | Admitting: Internal Medicine

## 2015-12-21 ENCOUNTER — Encounter: Payer: Self-pay | Admitting: Licensed Clinical Social Worker

## 2015-12-21 VITALS — BP 104/56 | HR 99 | Wt 168.0 lb

## 2015-12-21 DIAGNOSIS — J449 Chronic obstructive pulmonary disease, unspecified: Secondary | ICD-10-CM | POA: Insufficient documentation

## 2015-12-21 DIAGNOSIS — I110 Hypertensive heart disease with heart failure: Secondary | ICD-10-CM | POA: Diagnosis not present

## 2015-12-21 DIAGNOSIS — Z833 Family history of diabetes mellitus: Secondary | ICD-10-CM | POA: Insufficient documentation

## 2015-12-21 DIAGNOSIS — I2510 Atherosclerotic heart disease of native coronary artery without angina pectoris: Secondary | ICD-10-CM | POA: Insufficient documentation

## 2015-12-21 DIAGNOSIS — I428 Other cardiomyopathies: Secondary | ICD-10-CM | POA: Diagnosis not present

## 2015-12-21 DIAGNOSIS — I5022 Chronic systolic (congestive) heart failure: Secondary | ICD-10-CM | POA: Diagnosis present

## 2015-12-21 DIAGNOSIS — R911 Solitary pulmonary nodule: Secondary | ICD-10-CM | POA: Insufficient documentation

## 2015-12-21 DIAGNOSIS — E785 Hyperlipidemia, unspecified: Secondary | ICD-10-CM | POA: Diagnosis not present

## 2015-12-21 DIAGNOSIS — Z8249 Family history of ischemic heart disease and other diseases of the circulatory system: Secondary | ICD-10-CM | POA: Diagnosis not present

## 2015-12-21 DIAGNOSIS — Z8547 Personal history of malignant neoplasm of testis: Secondary | ICD-10-CM | POA: Insufficient documentation

## 2015-12-21 DIAGNOSIS — Z87891 Personal history of nicotine dependence: Secondary | ICD-10-CM | POA: Diagnosis not present

## 2015-12-21 DIAGNOSIS — Z7982 Long term (current) use of aspirin: Secondary | ICD-10-CM | POA: Diagnosis not present

## 2015-12-21 DIAGNOSIS — I350 Nonrheumatic aortic (valve) stenosis: Secondary | ICD-10-CM | POA: Insufficient documentation

## 2015-12-21 DIAGNOSIS — Z79899 Other long term (current) drug therapy: Secondary | ICD-10-CM | POA: Insufficient documentation

## 2015-12-21 MED ORDER — IVABRADINE HCL 5 MG PO TABS: 5.0000 mg | ORAL_TABLET | Freq: Two times a day (BID) | ORAL | Status: DC

## 2015-12-21 NOTE — Progress Notes (Signed)
Patient ID: Jordan Johnston, male   DOB: [birth year], 65 y.o.   MRN: [id number]    Advanced Heart Failure Clinic Note   [id number] Primary HF Cardiologist: Dr Jordan Johnston Primary Cardiologist: Dr Jordan Johnston  Vascular : Dr Jordan Johnston   HPI: Jordan Johnston is a 65 y.o. male with a history of testicular cancer, HTN, HLD, carotid stenosis s/p R CEA (2009), former tobacco abuse, LV dysfunction EF 10-15% 07/2015, CAD with known 100% LCx occlusion, mild AS/ mild MR, and chronic systolic heart failure.   Admitted 07/26/2015 with increase dyspnea and leg edema. Prior to admit he went to urgent care ~ 1 month ago and was diagnosed with CAP-->placed on Abx. He continued to decline. Take to cath lab for RHC/LHC as noted below. Diuresed with IV lasix and transitioned to 20 mg po lasix daily. He was not placed on bb due to hypotension. Discharge weight was 148 pounds.   He returns for HF follow up. Overall feels good. Denies SOB/PND/Orthopnea/CP.  He is not weighing at home. Appetite ok.  Followed at cardiac rehab. Taking all medications. Continues to live with his brother.   RHC/LHC 07/27/2015 PCWP 20  PVR 2.7 CO/CI 4.7 CI 2.43  -Mid CX 100% stenosed R to L collaterals. Mid RCA 30%. Ost70% stenosed. Mild disease in the LAD. Medically managed.   ECHO 07/27/2015: EF 10-15%. With mild AS. RV mod dysfunction.   Labs 08/10/2015: K 4.3 Creatinine 1.06 Dig level 2.5  Labs 08/01/2015: K 4.6 Creatinine 1.07  Labs 08/10/2015: K 4.3 Creatinine 1.06   FH: Brother CAD SH: Lives with brother. Former smoker. Does not drink alcohol.    ROS: All systems negative except as listed in HPI, PMH and Problem List.  SH:  Social History   Social History  . Marital Status: Single    Spouse Name: N/A  . Number of Children: N/A  . Years of Education: N/A   Occupational History  . Not on file.   Social History Main Topics  . Smoking status: Former Smoker -- 1.00 packs/day for 40 years    Types: Cigarettes    Quit date: 05/27/2015   . Smokeless tobacco: Never Used  . Alcohol Use: No  . Drug Use: No  . Sexual Activity: Not on file   Other Topics Concern  . Not on file   Social History Narrative    FH:  Family History  Problem Relation Age of Onset  . Diabetes Mother   . Alzheimer's disease Mother   . Coronary artery disease Father   . Melanoma Father     Past Medical History  Diagnosis Date  . History of testicular cancer     Remote, 30 yrs +  . Hernia   . History of CEA (carotid endarterectomy)     Right-sided in 2009  . HTN (hypertension)   . HLD (hyperlipidemia)   . CAD (coronary artery disease)   . CHF (congestive heart failure) ([place])   . Shortness of breath dyspnea     Current Outpatient Prescriptions  Medication Sig Dispense Refill  . aspirin 81 MG tablet Take 81 mg by mouth daily.    Marland Kitchen atorvastatin (LIPITOR) 80 MG tablet Take 1 tablet (80 mg total) by mouth daily at 6 PM. 30 tablet 11  . ivabradine (CORLANOR) 5 MG TABS tablet Take 1 tablet (5 mg total) by mouth 2 (two) times daily with a meal. 60 tablet 2  . losartan (COZAAR) 25 MG tablet Take 0.5 tablets (12.5 mg total) by  mouth daily. 30 tablet 11   No current facility-administered medications for this encounter.    Filed Vitals:   12/21/15 1355  BP: 104/56  Pulse: 99  Weight: 168 lb (76.204 kg)  SpO2: 98%     PHYSICAL EXAM:  General:  Ambulated into clinic without difficult. No resp difficulty. Brother present.  HEENT: normal x poor dentition Neck: supple. JVP 5-6 . Carotids 2+ bilaterally; no bruits. No thyromegaly or nodule noted. Cor: PMI laterally displaced. RRR. No rubs or murmurs appreciated. Minimal S3, improved from previous. AS  Lungs: CTAB, normal effort. Abdomen: soft, NT, ND, no HSM. No bruits or masses. +BS Extremities: no cyanosis, clubbing, rash, edema Neuro: alert & orientedx3, cranial nerves grossly intact. Moves all 4 extremities w/o difficulty. Affect pleasant.  EKG: NSR 100 BPM   ASSESSMENT &  PLAN: 1. 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 Most recent  Hospitalization was the end of October with ADHF. ECHO 07/27/2015 EF 10-15%.  He has trouble with med titration 2/2 hypotension.  NYHA  II. Volume status stable on exam.  Doing well in cardiac rehab - Continue to only take lasix as needed for weight > 155 pounds. He has not been taking.  - Continue losartan 12.5 mg daily and 12.5 mg spiro daily.  - Continue to hold off on BB with hypotension. He has dropped SBP into the 70s.  - Restart corlanor 5 mg bid. Given coupon for corlanor.   2.CAD- On LHC 07/27/2015  As well as CT of chest   Mid Cx lesion, 100% stenosed. Right to left collaterals fill a large OM2.  Mid RCA to Dist RCA lesion, 30% stenosed.  Ost 2nd Diag lesion, 70% stenosed. Mild disease in the LAD. Denies any CP. Continue aspirin 81 mg daily and statin.  - No BB with hypotension and some concerns for low output HF.  3. Former [place] on smoking cessation.  4. Hyperlipidemia-   - Continue atorvastatin 80 mg daily.  5. Pulmonary Nodule - No change between CT chest 2013 and 2016  6. COPD/Empysema  - on CT of chest 07/29/2015  - Stable.  7. CEA- S/P R CEA 2009 8. Aortic stenois - ECHO. Concern for low-gradient AS -Had dobutamine ECHO reviewed by Dr Jordan Johnston. Mod AS noted.  Plan to repeat ECHO in 6 months   Refer to EP for ICD. QRS 126 ms. So no role for BiV.   Follow up in 8 weeks.   Jordan Hasz NP-C  1:53 PM]

## 2015-12-21 NOTE — Patient Instructions (Signed)
Follow up in 8 weeks   Please follow with EP    Start corlanor 5 mg twice a day

## 2015-12-21 NOTE — Progress Notes (Addendum)
Advanced Heart Failure Medication Review by a Pharmacist  Does the patient  feel that his/her medications are working for him/her?  yes  Has the patient been experiencing any side effects to the medications prescribed?  no  Does the patient measure his/her own blood pressure or blood glucose at home?  no   Does the patient have any problems obtaining medications due to transportation or finances?   yes  Understanding of regimen: good Understanding of indications: good Potential of compliance: fair Patient understands to avoid NSAIDs. Patient understands to avoid decongestants.  Issues to address at subsequent visits: Adherence   Pharmacist comments: 65 YO male presenting with family member to HF clinic.  Pt denies SE to current regimen but states that he stopped taking Corlanor in February due to the copay increasing from $20 to $60.  Will followup with his pharmacy to see if we can offer additional assistance for his Corlanor.  Pt states that he is not taking spironolactone currently either.    Time with patient: 5 min  Preparation and documentation time: 5 min  Total time: 10 min

## 2015-12-22 ENCOUNTER — Encounter (HOSPITAL_COMMUNITY): Payer: 59

## 2015-12-22 NOTE — Progress Notes (Signed)
CSW referred to assist patient with PCP. Patient reports he has [employer] although does not have his card on his person at the time of the clinic visit. Patient provided variety of options for PCP providers and patient/caregiver state he would prefer to explore [facility] as he lives in vicinity. CSW provided number and encouraged patient to contcat member services at [employer] to assure PCP is within network to avoid additional fees. Patient/caregiver verbalize understanding and wil contact CSW if further assistance needed. Raquel Sarna, [place]

## 2015-12-24 ENCOUNTER — Encounter (HOSPITAL_COMMUNITY): Payer: 59

## 2015-12-24 ENCOUNTER — Encounter: Payer: Self-pay | Admitting: Internal Medicine

## 2015-12-24 ENCOUNTER — Ambulatory Visit (INDEPENDENT_AMBULATORY_CARE_PROVIDER_SITE_OTHER): Payer: 59 | Admitting: Internal Medicine

## 2015-12-24 VITALS — BP 100/56 | HR 86 | Ht 69.0 in | Wt 168.0 lb

## 2015-12-24 DIAGNOSIS — I5022 Chronic systolic (congestive) heart failure: Secondary | ICD-10-CM | POA: Diagnosis not present

## 2015-12-24 NOTE — Patient Instructions (Signed)
Medication Instructions:  Your physician recommends that you continue on your current medications as directed. Please refer to the Current Medication list given to you today.   Labwork: None ordered   Testing/Procedures: Your physician has recommended that you have a defibrillator inserted. An implantable cardioverter defibrillator (ICD) is a small device that is placed in your chest or, in rare cases, your abdomen. This device uses electrical pulses or shocks to help control life-threatening, irregular heartbeats that could lead the heart to suddenly stop beating (sudden cardiac arrest). Leads are attached to the ICD that goes into your heart. This is done in the hospital and usually requires an overnight stay. Please see the instruction sheet given to you today for more information.  Dates: 4/6, 4/11, 4/14, 4/17, 5/1, 5/5, 5/11, 5/16, 5/18, 5/22, 5/25, 5/30  Call if you decide to proceed  Follow-Up: Your physician recommends that you schedule a follow-up appointment as needed    Any Other Special Instructions Will Be Listed Below (If Applicable).     If you need a refill on your cardiac medications before your next appointment, please call your pharmacy.

## 2015-12-24 NOTE — Progress Notes (Signed)
HPI [name] is referred today for consideration of ICD implant. The patient is a 65 yo man with CAD, chronic systolic heart failure, and HTN. He has had longstanding LV dysfunction with an EF of 25% by echo a month ago. He was hospitalized several months ago with volume overload and diuresesed over 20 lbs. He has managed to keep the weight off. He denies a h/o syncope. He has had an occluded LCX at last cath.  He is though to have an ICM although it could be mixed. No Known Allergies   Current Outpatient Prescriptions  Medication Sig Dispense Refill  . aspirin 81 MG tablet Take 81 mg by mouth daily.    Marland Kitchen atorvastatin (LIPITOR) 80 MG tablet Take 1 tablet (80 mg total) by mouth daily at 6 PM. 30 tablet 11  . ivabradine (CORLANOR) 5 MG TABS tablet Take 1 tablet (5 mg total) by mouth 2 (two) times daily with a meal. 60 tablet 6  . losartan (COZAAR) 25 MG tablet Take 0.5 tablets (12.5 mg total) by mouth daily. 30 tablet 11   No current facility-administered medications for this visit.     Past Medical History  Diagnosis Date  . History of testicular cancer     Remote, 30 yrs +  . Hernia   . History of CEA (carotid endarterectomy)     Right-sided in 2009  . HTN (hypertension)   . HLD (hyperlipidemia)   . CAD (coronary artery disease)   . CHF (congestive heart failure) ([place])   . Shortness of breath dyspnea     ROS:   All systems reviewed and negative except as noted in the HPI.   Past Surgical History  Procedure Laterality Date  . Hernia repair    . Cardiac catheterization N/A 07/27/2015    Procedure: Right/Left Heart Cath and Coronary/Graft Angiography;  Surgeon: Jettie Booze, MD;  Location: [facility];  Service: Cardiovascular;  Laterality: N/A;     Family History  Problem Relation Age of Onset  . Diabetes Mother   . Alzheimer's disease Mother   . Coronary artery disease Father   . Melanoma Father      Social History   Social History  .  Marital Status: Single    Spouse Name: N/A  . Number of Children: N/A  . Years of Education: N/A   Occupational History  . Not on file.   Social History Main Topics  . Smoking status: Former Smoker -- 1.00 packs/day for 40 years    Types: Cigarettes    Quit date: 05/27/2015  . Smokeless tobacco: Never Used  . Alcohol Use: No  . Drug Use: No  . Sexual Activity: Not on file   Other Topics Concern  . Not on file   Social History Narrative     BP 100/56 mmHg  Pulse 86  Ht 5\' 9"  (1.753 m)  Wt 168 lb (76.204 kg)  BMI 24.80 kg/m2  Physical Exam:  Well appearing 65 yo man, NAD HEENT: Unremarkable Neck:  6 cm JVD, no thyromegally Lymphatics:  No adenopathy Back:  No CVA tenderness Lungs:  Clear with no wheezes HEART:  Regular rate rhythm, no murmurs, no rubs, no clicks Abd:  soft, positive bowel sounds, no organomegally, no rebound, no guarding Ext:  2 plus pulses, no edema, no cyanosis, no clubbing Skin:  No rashes no nodules Neuro:  CN II through XII intact, motor grossly intact  EKG - nsr with IVCD and  a QRS duration of 126.  Assess/Plan: 1. Chronic systolic heart failure - his EF is persistently reduced at 25%. He is unable to take beta blockers due to low output symptoms. I have discussed the risks/benefits/goals/expectations of ICD insertion with the patient and he will call us if he would like to schedule the procedure. 2. CAD - he denies anginal symptoms. Will follow. 3. Dyslipidemia - he will continue his statin therapy.   Mikle Bosworth.D.

## 2015-12-27 ENCOUNTER — Encounter (HOSPITAL_COMMUNITY): Payer: 59

## 2015-12-29 ENCOUNTER — Encounter (HOSPITAL_COMMUNITY): Payer: 59

## 2015-12-31 ENCOUNTER — Encounter (HOSPITAL_COMMUNITY): Payer: 59

## 2016-01-18 VITALS — BP 110/56 | HR 73 | Temp 98.4°F | Resp 16

## 2016-01-18 DIAGNOSIS — Z006 Encounter for examination for normal comparison and control in clinical research program: Secondary | ICD-10-CM

## 2016-02-10 NOTE — Progress Notes (Unsigned)
Subject present for [place] STUDY VISIT #3. No complaints or concerns verbalized. States he is doing well. Study drug dispensed per protocol. Next visit 2 weeks.

## 2016-02-11 NOTE — Progress Notes (Unsigned)
01-18-16 Subject present for screening visit for [place]. Subject informed of study and questions answered; subject signed consent to participate.

## 2016-02-15 ENCOUNTER — Encounter (HOSPITAL_COMMUNITY): Payer: Self-pay

## 2016-02-15 ENCOUNTER — Ambulatory Visit (HOSPITAL_COMMUNITY)
Admission: RE | Admit: 2016-02-15 | Discharge: 2016-02-15 | Disposition: A | Discharge status: Home / Self Care | Payer: 59 | Source: Ambulatory Visit | Attending: Adult Health | Admitting: Adult Health

## 2016-02-15 VITALS — BP 104/74 | HR 65 | Wt 176.5 lb

## 2016-02-15 DIAGNOSIS — Z79899 Other long term (current) drug therapy: Secondary | ICD-10-CM | POA: Insufficient documentation

## 2016-02-15 DIAGNOSIS — Z7982 Long term (current) use of aspirin: Secondary | ICD-10-CM | POA: Diagnosis not present

## 2016-02-15 DIAGNOSIS — R911 Solitary pulmonary nodule: Secondary | ICD-10-CM | POA: Insufficient documentation

## 2016-02-15 DIAGNOSIS — I2510 Atherosclerotic heart disease of native coronary artery without angina pectoris: Secondary | ICD-10-CM | POA: Diagnosis not present

## 2016-02-15 DIAGNOSIS — I350 Nonrheumatic aortic (valve) stenosis: Secondary | ICD-10-CM

## 2016-02-15 DIAGNOSIS — I5022 Chronic systolic (congestive) heart failure: Secondary | ICD-10-CM | POA: Insufficient documentation

## 2016-02-15 DIAGNOSIS — Z87891 Personal history of nicotine dependence: Secondary | ICD-10-CM | POA: Diagnosis not present

## 2016-02-15 DIAGNOSIS — I10 Essential (primary) hypertension: Secondary | ICD-10-CM | POA: Diagnosis not present

## 2016-02-15 DIAGNOSIS — I959 Hypotension, unspecified: Secondary | ICD-10-CM | POA: Diagnosis not present

## 2016-02-15 DIAGNOSIS — J449 Chronic obstructive pulmonary disease, unspecified: Secondary | ICD-10-CM | POA: Insufficient documentation

## 2016-02-15 DIAGNOSIS — I429 Cardiomyopathy, unspecified: Secondary | ICD-10-CM | POA: Diagnosis not present

## 2016-02-15 DIAGNOSIS — E785 Hyperlipidemia, unspecified: Secondary | ICD-10-CM | POA: Diagnosis not present

## 2016-02-15 NOTE — Progress Notes (Signed)
Patient ID: Jordan Johnston, male   DOB: August 11, 1951, 65 y.o.   MRN: [id number]    Advanced Heart Failure Clinic Note   [id number] Primary HF Cardiologist: Dr Haroldine Laws Primary Cardiologist: Dr Gwenlyn Found  Vascular : Dr Gwenlyn Found   HPI: Jordan Johnston is a 65 y.o. male with a history of testicular cancer, HTN, HLD, carotid stenosis s/p R CEA (2009), former tobacco abuse, LV dysfunction EF 10-15% 07/2015, CAD with known 100% LCx occlusion, mild AS/ mild MR, and chronic systolic heart failure.   Admitted 07/26/2015 with increase dyspnea and leg edema. Prior to admit he went to urgent care ~ 1 month ago and was diagnosed with CAP-->placed on Abx. He continued to decline. Take to cath lab for RHC/LHC as noted below. Diuresed with IV lasix and transitioned to 20 mg po lasix daily. He was not placed on bb due to hypotension. Discharge weight was 148 pounds.   He returns for HF follow up. He met with Dr Lovena Le for possible ICD however he would lke to hold off for now. Overall feels good. Denies SOB/PND/Orthopnea/CP. Able to walk up steps. He is not weighing at home. Appetite ok. Completed cardiac rehab. Taking all medications. Continues to live with his brother. 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   RHC/LHC 07/27/2015 PCWP 20  PVR 2.7 CO/CI 4.7 CI 2.43  -Mid CX 100% stenosed R to L collaterals. Mid RCA 30%. Ost70% stenosed. Mild disease in the LAD. Medically managed.   ECHO 07/27/2015: EF 10-15%. With mild AS. RV mod dysfunction.   Labs 08/10/2015: K 4.3 Creatinine 1.06 Dig level 2.5  Labs 08/01/2015: K 4.6 Creatinine 1.07  Labs 08/10/2015: K 4.3 Creatinine 1.06   FH: Brother CAD SH: Lives with brother. Former smoker. Does not drink alcohol.    ROS: All systems negative except as listed in HPI, PMH and Problem List.  SH:  Social History   Social History  . Marital Status: Single    Spouse Name: N/A  . Number of Children: N/A  . Years of Education: N/A   Occupational History  . Not on file.    Social History Main Topics  . Smoking status: Former Smoker -- 1.00 packs/day for 40 years    Types: Cigarettes    Quit date: 05/27/2015  . Smokeless tobacco: Never Used  . Alcohol Use: No  . Drug Use: No  . Sexual Activity: Not on file   Other Topics Concern  . Not on file   Social History Narrative    FH:  Family History  Problem Relation Age of Onset  . Diabetes Mother   . Alzheimer's disease Mother   . Coronary artery disease Father   . Melanoma Father     Past Medical History  Diagnosis Date  . History of testicular cancer     Remote, 30 yrs +  . Hernia   . History of CEA (carotid endarterectomy)     Right-sided in 2009  . HTN (hypertension)   . HLD (hyperlipidemia)   . CAD (coronary artery disease)   . CHF (congestive heart failure) ([place])   . Shortness of breath dyspnea     Current Outpatient Prescriptions  Medication Sig Dispense Refill  . aspirin 81 MG tablet Take 81 mg by mouth daily.    Marland Kitchen atorvastatin (LIPITOR) 80 MG tablet Take 1 tablet (80 mg total) by mouth daily at 6 PM. 30 tablet 11  . ivabradine (CORLANOR) 5 MG TABS tablet Take 1 tablet (5 mg total)  by mouth 2 (two) times daily with a meal. 60 tablet 6  . losartan (COZAAR) 25 MG tablet Take 0.5 tablets (12.5 mg total) by mouth daily. 30 tablet 11   No current facility-administered medications for this encounter.    Filed Vitals:   02/15/16 1405  BP: 104/74  Pulse: 65  Weight: 176 lb 8 oz (80.06 kg)  SpO2: 97%     PHYSICAL EXAM:  General:  Ambulated into clinic without difficult. No resp difficulty.  HEENT: normal x poor dentition Neck: supple. JVP 5-6 . Carotids 2+ bilaterally; no bruits. No thyromegaly or nodule noted. Cor: PMI laterally displaced. RRR. No rubs or murmurs appreciated. Minimal S3, improved from previous. AS  Lungs: CTAB, normal effort. Abdomen: soft, NT, ND, no HSM. No bruits or masses. +BS Extremities: no cyanosis, clubbing, rash, edema Neuro: alert & orientedx3,  cranial nerves grossly intact. Moves all 4 extremities w/o difficulty. Affect pleasant.  ASSESSMENT & PLAN: 1. 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 Most recent  Hospitalization was the end of October with ADHF. ECHO 07/27/2015 EF 10-15%. [facility] 10/2015 EF 25-30%.   He has had trouble with med titration 2/2 hypotension.  NYHA  II. Volume status stable on exam.  - Continue to only take lasix as needed for weight > 155 pounds. He has not been taking.  - Continue losartan 12.5 mg daily and 12.5 mg spiro daily.  - Continue to hold off on BB with hypotension. He has dropped SBP into the 70s.  - Continue  corlanor 5 mg bid. Pulse 65 so no need to increase.    2.CAD- On LHC 07/27/2015  As well as CT of chest   Mid Cx lesion, 100% stenosed. Right to left collaterals fill a large OM2.  Mid RCA to Dist RCA lesion, 30% stenosed.  Ost 2nd Diag lesion, 70% stenosed. Mild disease in the LAD. Denies any CP. Continue aspirin 81 mg daily and statin.  - No BB with hypotension and some concerns for low output HF.  3. Former [place] on smoking cessation.  4. Hyperlipidemia-   - Continue atorvastatin 80 mg daily.  5. Pulmonary Nodule - No change between CT chest 2013 and 2016  6. COPD/Empysema  - on CT of chest 07/29/2015  - Stable.  7. CEA- S/P R CEA 2009 8. Aortic stenois - ECHO. Had dobutamine ECHO reviewed by Dr Haroldine Laws. Mod AS noted.  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   Follow up in 3 months with an ECHO and Dr Haroldine Laws. Darrick Grinder NP-C  2:13 PM]

## 2016-02-15 NOTE — Patient Instructions (Signed)
We will contact you in 3 months to schedule your next appointment and echocardiogram  

## 2016-05-18 DIAGNOSIS — Z006 Encounter for examination for normal comparison and control in clinical research program: Secondary | ICD-10-CM

## 2016-05-18 NOTE — Progress Notes (Signed)
Patient here for [place] Study Visit 5. Verbalizes he is doing well with study medication and has no concerns. He is awaiting call from HF Clinic for follow up appointment and Echo. Writer will f/u as well.

## 2016-05-19 ENCOUNTER — Other Ambulatory Visit: Payer: Self-pay | Admitting: *Deleted

## 2016-05-19 ENCOUNTER — Telehealth: Payer: Self-pay

## 2016-05-19 ENCOUNTER — Observation Stay (HOSPITAL_COMMUNITY)
Admission: EM | Admit: 2016-05-19 | Discharge: 2016-05-26 | Disposition: A | Discharge status: Home / Self Care | Payer: 59 | Attending: Family Medicine | Admitting: Family Medicine

## 2016-05-19 ENCOUNTER — Encounter (HOSPITAL_COMMUNITY): Payer: Self-pay | Admitting: Vascular Surgery

## 2016-05-19 DIAGNOSIS — D649 Anemia, unspecified: Secondary | ICD-10-CM | POA: Diagnosis present

## 2016-05-19 DIAGNOSIS — K644 Residual hemorrhoidal skin tags: Secondary | ICD-10-CM | POA: Insufficient documentation

## 2016-05-19 DIAGNOSIS — D62 Acute posthemorrhagic anemia: Principal | ICD-10-CM | POA: Insufficient documentation

## 2016-05-19 DIAGNOSIS — Z87891 Personal history of nicotine dependence: Secondary | ICD-10-CM | POA: Diagnosis not present

## 2016-05-19 DIAGNOSIS — R7303 Prediabetes: Secondary | ICD-10-CM | POA: Diagnosis not present

## 2016-05-19 DIAGNOSIS — R933 Abnormal findings on diagnostic imaging of other parts of digestive tract: Secondary | ICD-10-CM

## 2016-05-19 DIAGNOSIS — I110 Hypertensive heart disease with heart failure: Secondary | ICD-10-CM | POA: Diagnosis not present

## 2016-05-19 DIAGNOSIS — D125 Benign neoplasm of sigmoid colon: Secondary | ICD-10-CM | POA: Diagnosis not present

## 2016-05-19 DIAGNOSIS — N179 Acute kidney failure, unspecified: Secondary | ICD-10-CM | POA: Diagnosis not present

## 2016-05-19 DIAGNOSIS — I447 Left bundle-branch block, unspecified: Secondary | ICD-10-CM | POA: Diagnosis not present

## 2016-05-19 DIAGNOSIS — Z8547 Personal history of malignant neoplasm of testis: Secondary | ICD-10-CM | POA: Diagnosis not present

## 2016-05-19 DIAGNOSIS — C801 Malignant (primary) neoplasm, unspecified: Secondary | ICD-10-CM

## 2016-05-19 DIAGNOSIS — I2510 Atherosclerotic heart disease of native coronary artery without angina pectoris: Secondary | ICD-10-CM | POA: Diagnosis not present

## 2016-05-19 DIAGNOSIS — D509 Iron deficiency anemia, unspecified: Secondary | ICD-10-CM

## 2016-05-19 DIAGNOSIS — D123 Benign neoplasm of transverse colon: Secondary | ICD-10-CM | POA: Insufficient documentation

## 2016-05-19 DIAGNOSIS — I350 Nonrheumatic aortic (valve) stenosis: Secondary | ICD-10-CM | POA: Diagnosis not present

## 2016-05-19 DIAGNOSIS — Z006 Encounter for examination for normal comparison and control in clinical research program: Secondary | ICD-10-CM | POA: Diagnosis not present

## 2016-05-19 DIAGNOSIS — Z7982 Long term (current) use of aspirin: Secondary | ICD-10-CM | POA: Diagnosis not present

## 2016-05-19 DIAGNOSIS — I5022 Chronic systolic (congestive) heart failure: Secondary | ICD-10-CM | POA: Insufficient documentation

## 2016-05-19 DIAGNOSIS — E785 Hyperlipidemia, unspecified: Secondary | ICD-10-CM | POA: Insufficient documentation

## 2016-05-19 DIAGNOSIS — D500 Iron deficiency anemia secondary to blood loss (chronic): Secondary | ICD-10-CM

## 2016-05-19 DIAGNOSIS — D122 Benign neoplasm of ascending colon: Secondary | ICD-10-CM | POA: Diagnosis not present

## 2016-05-19 DIAGNOSIS — K269 Duodenal ulcer, unspecified as acute or chronic, without hemorrhage or perforation: Secondary | ICD-10-CM | POA: Diagnosis not present

## 2016-05-19 LAB — IRON AND TIBC
Iron: 31 ug/dL — ABNORMAL LOW (ref 45–182)
SATURATION RATIOS: 9 % — AB (ref 17.9–39.5)
TIBC: 361 ug/dL (ref 250–450)
UIBC: 330 ug/dL

## 2016-05-19 LAB — COMPREHENSIVE METABOLIC PANEL
ALK PHOS: 78 U/L (ref 38–126)
ALT: 12 U/L — AB (ref 17–63)
AST: 18 U/L (ref 15–41)
Albumin: 3.1 g/dL — ABNORMAL LOW (ref 3.5–5.0)
Anion gap: 6 (ref 5–15)
BUN: 23 mg/dL — AB (ref 6–20)
CALCIUM: 8.8 mg/dL — AB (ref 8.9–10.3)
CHLORIDE: 107 mmol/L (ref 101–111)
CO2: 24 mmol/L (ref 22–32)
CREATININE: 1.54 mg/dL — AB (ref 0.61–1.24)
GFR, EST AFRICAN AMERICAN: 53 mL/min — AB (ref >60)
GFR, EST NON AFRICAN AMERICAN: 46 mL/min — AB (ref >60)
Glucose, Bld: 107 mg/dL — ABNORMAL HIGH (ref 65–99)
Potassium: 4.4 mmol/L (ref 3.5–5.1)
Sodium: 137 mmol/L (ref 135–145)
Total Bilirubin: 0.6 mg/dL (ref 0.3–1.2)
Total Protein: 7 g/dL (ref 6.5–8.1)

## 2016-05-19 LAB — RETICULOCYTES
RBC.: 2.45 MIL/uL — ABNORMAL LOW (ref 4.22–5.81)
RETIC CT PCT: 2.8 % (ref 0.4–3.1)
Retic Count, Absolute: 68.6 10*3/uL (ref 19.0–186.0)

## 2016-05-19 LAB — CBC
HCT: 21.8 % — ABNORMAL LOW (ref 39.0–52.0)
Hemoglobin: 6.5 g/dL — CL (ref 13.0–17.0)
MCH: 26.4 pg (ref 26.0–34.0)
MCHC: 29.8 g/dL — ABNORMAL LOW (ref 30.0–36.0)
MCV: 88.6 fL (ref 78.0–100.0)
PLATELETS: 292 10*3/uL (ref 150–400)
RBC: 2.46 MIL/uL — ABNORMAL LOW (ref 4.22–5.81)
RDW: 18.1 % — AB (ref 11.5–15.5)
WBC: 5.7 10*3/uL (ref 4.0–10.5)

## 2016-05-19 LAB — FERRITIN: Ferritin: 15 ng/mL — ABNORMAL LOW (ref 24–336)

## 2016-05-19 LAB — VITAMIN B12: Vitamin B-12: 193 pg/mL (ref 180–914)

## 2016-05-19 LAB — POC OCCULT BLOOD, ED: FECAL OCCULT BLD: POSITIVE — AB

## 2016-05-19 LAB — PREPARE RBC (CROSSMATCH)

## 2016-05-19 MED ORDER — AMBULATORY NON FORMULARY MEDICATION: 10.0000 mg | Freq: Every day | Status: AC

## 2016-05-19 MED ORDER — SODIUM CHLORIDE 0.9% FLUSH
3.0000 mL | Freq: Two times a day (BID) | INTRAVENOUS | Status: DC
  Administered 2016-05-19 – 2016-05-26 (×13): 3 mL via INTRAVENOUS

## 2016-05-19 MED ORDER — ATORVASTATIN CALCIUM 80 MG PO TABS
80.0000 mg | ORAL_TABLET | Freq: Every day | ORAL | Status: DC
Start: 2016-05-19 — End: 2016-05-26
  Administered 2016-05-19 – 2016-05-25 (×7): 80 mg via ORAL
  Filled 2016-05-19 (×7): qty 1

## 2016-05-19 MED ORDER — IVABRADINE HCL 5 MG PO TABS
5.0000 mg | ORAL_TABLET | Freq: Two times a day (BID) | ORAL | Status: DC
  Administered 2016-05-20 – 2016-05-26 (×12): 5 mg via ORAL
  Filled 2016-05-19 (×14): qty 1

## 2016-05-19 MED ORDER — SODIUM CHLORIDE 0.9 % IV SOLN
Freq: Once | INTRAVENOUS | Status: AC
  Administered 2016-05-19: 19:00:00 via INTRAVENOUS

## 2016-05-19 MED ORDER — LOSARTAN POTASSIUM 25 MG PO TABS
12.5000 mg | ORAL_TABLET | Freq: Every day | ORAL | Status: DC
  Administered 2016-05-20 – 2016-05-25 (×6): 12.5 mg via ORAL
  Filled 2016-05-19 (×7): qty 0.5

## 2016-05-19 NOTE — H&P (Signed)
[facility] Admission History and Physical Service Pager: [phone]  Patient name: Jordan Johnston Medical record number: [id number] Date of birth: [birth year] Age: 65 y.o. Gender: male  Primary Care Provider: No PCP Per Patient Consultants: None. Code Status: Full  Chief Complaint: Weakness  Assessment and Plan: DUSHAWN SARK is a 65 y.o. male presenting with weakness with significant anemia. PMH is significant for coronary artery disease, HFrEF (EF 25-30%), history of right-sided carotid endarterectomy (2009), history of testicular cancer, hyperlipidemia. Former smoker.  # Anemia: Suspected to be etiology of patient's weakness. Hemoglobin low at 6.5, normal MCV. Last hemoglobin 10.2 in November 2016. Iron panel showing low iron of 31, ferritin low at 15. Vitamin B12 normal at 193. Fecal occult blood positive. Last colonoscopy in 2012, recommended to repeat in 2017. EKG with left bundle branch block; also noted on prior EKGs. History of cardiac catheterization in 2016 with mid circumflex lesion 100% stenosed with right to left collaterals, mid RCA to distal RCA 30% stenosed, mild disease in LAD, diagonal lesion 70% stenosed. Last echocardiogram February 2017 with EF 25-30%, mild LVH, systolic function severely reduced, diffuse hypokinesis. Previous labs shows history of elevated prothrombin time to 17.3 in November 2016; normal INR. Heart rate stable. Also of note, he is involved in her drug research study for heart and vascular kidney disease it was noted incidentally to be anemic. Transfusion of 2 units initiated in emergency department. - Place in observation, Dr. Ardelia Mems attending -Troponin -Urinalysis -PT/INR, PTT -Will obtain echocardiogram. Concern for worsening of heart failure given significant anemia suspected over the last month. -Transfusion initiated in ED. Will check posttransfusion H/H -Will need GI follow-up for colonoscopy. Consider inpatient versus  outpatient setting. - Attempting to get in touch with researchers for medication (Vericiguat) to determine if there is a link to GI bleed.  # Acute Kidney Injury: Creatinine 1.54 admission. Baseline 1.07. -Suspect prerenal etiology due to significant anemia. -Currently receiving 2 units of blood. -Will check BMP in the morning.  -If creatinine still elevated consider fluids.  #HFrEF: Currently prescribed Corlanor 5 mg twice daily, losartan 12.5 mg daily. Also in drug research trial for heart failure medication (Vericiguat). Echocardiogram from February 2017 with EF 25-30%, mild LVH, systolic function severely reduced, diffuse hypokinesis. -Repeat echocardiogram -Continue Corlanor and Losartan 12.5mg  -Holding aspirin  #Hyperlipidemia: Continue atorvastatin 80 mg.  #Prediabetes: A1c elevated to 6.1 in October 2016. Glucose elevated at 107 today. -Check A1c  FEN/GI: Heart healthy diet Prophylaxis: SCDs (concern for active bleed)  Disposition: Home  History of Present Illness:  Jordan Johnston is a 65 y.o. male presenting with anemia. Is currently in a drug research study for heart and vascular disease and was noted incidentally to be anemic on labs obtained yesterday. Reports he has noted some dizziness currently throughout the day for the last month. States he had been in the drug trial for 3 months, and that the dose of the drug was increased one month ago; he believed the symptoms were due to the increase in medication. Denies weakness, fatigue, shortness of breath, blurred vision, chest pain. Denies any bloody stools, hematuria, or hematemesis. Reports he has regular bowel movements a few times a day. Denies any change in appetite. Denies any weight loss. Last colonoscopy in 2012; recommended to repeat in 5years. Notes history of testicular cancer approximately 30 years ago. Does not smoke but history of smoking noted. Does not drink. Is not doing any other illicit drugs. Works as a  welder.  Review Of Systems: Per HPI  Otherwise the remainder of the systems were negative.  Patient Active Problem List   Diagnosis Date Noted  . Hyperlipemia 09/14/2015  . Chronic systolic heart failure ([place]) 08/25/2015  . Former smoker 08/10/2015  . CAD (coronary atherosclerotic disease) 08/10/2015  . Acute on chronic systolic congestive heart failure ([place])   . Aortic stenosis   . CHF (congestive heart failure) ([place]) 07/26/2015  . Lung nodule 05/06/2012  . Hypomagnesemia 05/06/2012  . Systolic murmur [id number]  . Abnormal x-ray of lung 05/05/2012  . Pre-syncope 05/05/2012  . Carotid artery disease ([place]) 05/05/2012    Past Medical History: Past Medical History:  Diagnosis Date  . CAD (coronary artery disease)   . CHF (congestive heart failure) ([place])   . Hernia   . History of CEA (carotid endarterectomy)    Right-sided in 2009  . History of testicular cancer    Remote, 30 yrs +  . HLD (hyperlipidemia)   . HTN (hypertension)   . Shortness of breath dyspnea     Past Surgical History: Past Surgical History:  Procedure Laterality Date  . CARDIAC CATHETERIZATION N/A 07/27/2015   Procedure: Right/Left Heart Cath and Coronary/Graft Angiography;  Surgeon: Jettie Booze, MD;  Location: [facility];  Service: Cardiovascular;  Laterality: N/A;  . HERNIA REPAIR      Social History: Social History  Substance Use Topics  . Smoking status: Former Smoker    Packs/day: 1.00    Years: 40.00    Types: Cigarettes    Quit date: 05/27/2015  . Smokeless tobacco: Never Used  . Alcohol use No   Please also refer to relevant sections of EMR.  Family History: Family History  Problem Relation Age of Onset  . Diabetes Mother   . Alzheimer's disease Mother   . Coronary artery disease Father   . Melanoma Father    Allergies and Medications: No Known Allergies No current facility-administered medications on file prior to encounter.    Current Outpatient Prescriptions  on File Prior to Encounter  Medication Sig Dispense Refill  . aspirin 81 MG tablet Take 81 mg by mouth daily.    Marland Kitchen atorvastatin (LIPITOR) 80 MG tablet Take 1 tablet (80 mg total) by mouth daily at 6 PM. 30 tablet 11  . ivabradine (CORLANOR) 5 MG TABS tablet Take 1 tablet (5 mg total) by mouth 2 (two) times daily with a meal. 60 tablet 6  . losartan (COZAAR) 25 MG tablet Take 0.5 tablets (12.5 mg total) by mouth daily. 30 tablet 11   Objective: BP (!) 120/47   Pulse 64   Temp 98.4 F (36.9 C) (Oral)   Resp 16   SpO2 100%  Exam: General: 65yo male resting comfortably in no apparent distress Eyes: PERRLA, white sclera, pale conjunctiva ENTM: Moist mucous membranes, poor dentician Neck: Supple, no lymphadenopathy Cardiovascular: S1 and S2 noted, systolic murmur noted, regular rate and rhythm Respiratory: Clear to auscultation bilaterally, no wheezes, no rales, no rhonchi, no increased work of breathing Abdomen: Bowel sounds noted, soft and nondistended, nontender MSK: No edema noted, Muscle strength 5/5 in upper and lower extremities Skin: Pale, Warm Neuro: CN II-XII Intact, Sensation intact over bilateral upper and lower extremities Psych: AAOx3  Labs and Imaging: CBC BMET   Recent Labs Lab 05/19/16 1557  WBC 5.7  HGB 6.5*  HCT 21.8*  PLT 292    Recent Labs Lab 05/19/16 1557  NA 137  K 4.4  CL 107  CO2 24  BUN 23*  CREATININE 1.54*  GLUCOSE 107*  CALCIUM 8.8*    - Iron Panel: Iron 31, Ferritin 15, Vitamin B12 193 - FOBT Positive  Lorna Few, DO 05/19/2016, 8:10 PM PGY-3, [place] Intern pager: [phone], text pages welcome

## 2016-05-19 NOTE — ED Notes (Signed)
Patient stable for transport at this time.  Will be taken to 5W on telemetry

## 2016-05-19 NOTE — ED Provider Notes (Signed)
[facility] DEPT Provider Note   CSN: [id number] Arrival date & time: 05/19/16  1529     History   Chief Complaint Chief Complaint  Patient presents with  . Abnormal Lab    HPI Jordan Johnston is a 65 y.o. male.  Patient complains of weakness. He recently had his blood drawn and he was told that he had a low blood count. Patient has a history of congestive heart   The history is provided by the patient. No language interpreter was used.  Weakness  This is a new problem. The current episode started more than 2 days ago. The problem occurs constantly. The problem has not changed since onset.Pertinent negatives include no chest pain, no abdominal pain and no headaches. Nothing aggravates the symptoms. Nothing relieves the symptoms. He has tried nothing for the symptoms. The treatment provided no relief.    Past Medical History:  Diagnosis Date  . CAD (coronary artery disease)   . CHF (congestive heart failure) ([place])   . Hernia   . History of CEA (carotid endarterectomy)    Right-sided in 2009  . History of testicular cancer    Remote, 30 yrs +  . HLD (hyperlipidemia)   . HTN (hypertension)   . Shortness of breath dyspnea     Patient Active Problem List   Diagnosis Date Noted  . Anemia 05/19/2016  . Hyperlipemia 09/14/2015  . Chronic systolic heart failure ([place]) 08/25/2015  . Former smoker 08/10/2015  . CAD (coronary atherosclerotic disease) 08/10/2015  . Acute on chronic systolic congestive heart failure ([place])   . Aortic stenosis   . CHF (congestive heart failure) ([place]) 07/26/2015  . Lung nodule 05/06/2012  . Hypomagnesemia 05/06/2012  . Systolic murmur [id number]  . Abnormal x-ray of lung 05/05/2012  . Pre-syncope 05/05/2012  . Carotid artery disease ([place]) 05/05/2012    Past Surgical History:  Procedure Laterality Date  . CARDIAC CATHETERIZATION N/A 07/27/2015   Procedure: Right/Left Heart Cath and Coronary/Graft Angiography;  Surgeon: Jettie Booze,  MD;  Location: [facility];  Service: Cardiovascular;  Laterality: N/A;  . HERNIA REPAIR         Home Medications    Prior to Admission medications   Medication Sig Start Date End Date Taking? Authorizing Provider  aspirin 81 MG tablet Take 81 mg by mouth daily.   Yes Historical Provider, MD  atorvastatin (LIPITOR) 80 MG tablet Take 1 tablet (80 mg total) by mouth daily at 6 PM. 08/01/15  Yes Brett Canales, PA-C  ivabradine (CORLANOR) 5 MG TABS tablet Take 1 tablet (5 mg total) by mouth 2 (two) times daily with a meal. 12/21/15  Yes Amy D Clegg, NP  losartan (COZAAR) 25 MG tablet Take 0.5 tablets (12.5 mg total) by mouth daily. 08/01/15  Yes Einar Pheasant Hager, PA-C  AMBULATORY NON FORMULARY MEDICATION Take 10 mg by mouth daily. Medication Name: [place] study drug provided 01/26/16   Larey Dresser, MD    Family History Family History  Problem Relation Age of Onset  . Diabetes Mother   . Alzheimer's disease Mother   . Coronary artery disease Father   . Melanoma Father     Social History Social History  Substance Use Topics  . Smoking status: Former Smoker    Packs/day: 1.00    Years: 40.00    Types: Cigarettes    Quit date: 05/27/2015  . Smokeless tobacco: Never Used  . Alcohol use No  Allergies   Review of patient's allergies indicates no known allergies.   Review of Systems Review of Systems  Constitutional: Negative for appetite change and fatigue.  HENT: Negative for congestion, ear discharge and sinus pressure.   Eyes: Negative for discharge.  Respiratory: Negative for cough.   Cardiovascular: Negative for chest pain.  Gastrointestinal: Negative for abdominal pain and diarrhea.  Genitourinary: Negative for frequency and hematuria.  Musculoskeletal: Negative for back pain.  Skin: Negative for rash.  Neurological: Positive for weakness. Negative for seizures and headaches.  Psychiatric/Behavioral: Negative for  hallucinations.     Physical Exam Updated Vital Signs BP (!) 120/47   Pulse 64   Temp 98.4 F (36.9 C) (Oral)   Resp 16   SpO2 100%   Physical Exam  Constitutional: He is oriented to person, place, and time. He appears well-developed.  HENT:  Head: Normocephalic.  Eyes: Conjunctivae and EOM are normal. No scleral icterus.  Neck: Neck supple. No thyromegaly present.  Cardiovascular: Normal rate and regular rhythm.  Exam reveals no gallop and no friction rub.   No murmur heard. Pulmonary/Chest: No stridor. He has no wheezes. He has no rales. He exhibits no tenderness.  Abdominal: He exhibits no distension. There is no tenderness. There is no rebound.  Genitourinary: Rectal exam shows guaiac positive stool.  Genitourinary Comments: Rectal exam brown stool heme-positive  Musculoskeletal: Normal range of motion. He exhibits no edema.  Lymphadenopathy:    He has no cervical adenopathy.  Neurological: He is oriented to person, place, and time. He exhibits normal muscle tone. Coordination normal.  Skin: No rash noted. No erythema.  Psychiatric: He has a normal mood and affect. His behavior is normal.     ED Treatments / Results  Labs (all labs ordered are listed, but only abnormal results are displayed) Labs Reviewed  COMPREHENSIVE METABOLIC PANEL - Abnormal; Notable for the following:       Result Value   Glucose, Bld 107 (*)    BUN 23 (*)    Creatinine, Ser 1.54 (*)    Calcium 8.8 (*)    Albumin 3.1 (*)    ALT 12 (*)    GFR calc non Af Amer 46 (*)    GFR calc Af Amer 53 (*)    All other components within normal limits  CBC - Abnormal; Notable for the following:    RBC 2.46 (*)    Hemoglobin 6.5 (*)    HCT 21.8 (*)    MCHC 29.8 (*)    RDW 18.1 (*)    All other components within normal limits  IRON AND TIBC - Abnormal; Notable for the following:    Iron 31 (*)    Saturation Ratios 9 (*)    All other components within normal limits  FERRITIN - Abnormal; Notable for  the following:    Ferritin 15 (*)    All other components within normal limits  RETICULOCYTES - Abnormal; Notable for the following:    RBC. 2.45 (*)    All other components within normal limits  POC OCCULT BLOOD, ED - Abnormal; Notable for the following:    Fecal Occult Bld POSITIVE (*)    All other components within normal limits  VITAMIN B12  FOLATE  OCCULT BLOOD X 1 CARD TO LAB, STOOL  TYPE AND SCREEN  PREPARE RBC (CROSSMATCH)    EKG  EKG Interpretation None       Radiology No results found.  Procedures Procedures (including critical care time)  Medications  Ordered in ED Medications  0.9 %  sodium chloride infusion ( Intravenous New Bag/Given 05/19/16 1911)     Initial Impression / Assessment and Plan / ED Course  I have reviewed the triage vital signs and the nursing notes.  Pertinent labs & imaging results that were available during my care of the patient were reviewed by me and considered in my medical decision making (see chart for details).  Clinical Course    Patient with anemia. He will be transfused and admitted to observation in the hospital  Final Clinical Impressions(s) / ED Diagnoses   Final diagnoses:  Iron deficiency anemia    New Prescriptions New Prescriptions   AMBULATORY NON FORMULARY MEDICATION    Take 10 mg by mouth daily. Medication Name: [place] study drug provided     Milton Ferguson, MD 05/19/16 2017

## 2016-05-19 NOTE — ED Notes (Signed)
2 units of blood ready in blood bank for pt.

## 2016-05-19 NOTE — Telephone Encounter (Signed)
After making Dr. Aundra Dubin aware of Hgb 6.9 instructions were given to call patient and have him come to ED for evaluation and treatment. Patient denies dark stools or any other bleeding. States "I will come on over to emergency room." Encouraged him to let someone drive him. He is considering that. He states "I feel ok." Informed that Hgb that low puts him at risk of cardiac arrest or syncopal episode. Patient verbalized understanding and states he will be here shortly.   Report and copy of lab given to ED Triage nurse.

## 2016-05-19 NOTE — Progress Notes (Signed)
New Admission Note:   Arrival Method: ED Mental Orientation: Telemetry:  BOX 28 Assessment: Completed Skin: N/A IV: LAC  Pain: 0 Tubes:  Safety Measures: Safety Fall Prevention Plan has been given, discussed and signed Admission: Completed [place] Orientation: Patient has been orientated to the room, unit and staff.    Orders have been reviewed and implemented. Will continue to monitor the patient. Call light has been placed within reach and bed alarm has been activated.   George Hugh  BSN, RN  Phone [phone]

## 2016-05-19 NOTE — ED Notes (Signed)
Trejon Codding, [phone], brother.

## 2016-05-19 NOTE — ED Triage Notes (Signed)
Pt reports to the ED for eval of low Hb. Pt is involved in a drug research study for heart and vascular disease and he had some blood drawn and was found to be anemic. Pt reports some dizziness which he originally contributed to the medication. Denies any SOB, CP, or excessive fatigue. He is pale. Pt denies any blood in his stool, hematemesis, or other excessive bleeding. Is on ASA but not any other blood thinners. Pt A&Ox4 and resp e/u.

## 2016-05-20 LAB — CBC
HEMATOCRIT: 28.7 % — AB (ref 39.0–52.0)
HEMOGLOBIN: 8.8 g/dL — AB (ref 13.0–17.0)
MCH: 27.2 pg (ref 26.0–34.0)
MCHC: 30.7 g/dL (ref 30.0–36.0)
MCV: 88.6 fL (ref 78.0–100.0)
Platelets: 265 10*3/uL (ref 150–400)
RBC: 3.24 MIL/uL — AB (ref 4.22–5.81)
RDW: 17 % — ABNORMAL HIGH (ref 11.5–15.5)
WBC: 6.4 10*3/uL (ref 4.0–10.5)

## 2016-05-20 LAB — BASIC METABOLIC PANEL
Anion gap: 8 (ref 5–15)
BUN: 23 mg/dL — AB (ref 6–20)
CHLORIDE: 105 mmol/L (ref 101–111)
CO2: 24 mmol/L (ref 22–32)
Calcium: 9 mg/dL (ref 8.9–10.3)
Creatinine, Ser: 1.2 mg/dL (ref 0.61–1.24)
GFR calc Af Amer: >60 mL/min (ref >60)
GFR calc non Af Amer: >60 mL/min (ref >60)
GLUCOSE: 93 mg/dL (ref 65–99)
POTASSIUM: 4.2 mmol/L (ref 3.5–5.1)
Sodium: 137 mmol/L (ref 135–145)

## 2016-05-20 LAB — TYPE AND SCREEN
ABO/RH(D): O POS
ANTIBODY SCREEN: NEGATIVE
UNIT DIVISION: 0
Unit division: 0

## 2016-05-20 LAB — URINALYSIS, ROUTINE W REFLEX MICROSCOPIC
Bilirubin Urine: NEGATIVE
GLUCOSE, UA: NEGATIVE mg/dL
HGB URINE DIPSTICK: NEGATIVE
Ketones, ur: NEGATIVE mg/dL
Leukocytes, UA: NEGATIVE
Nitrite: NEGATIVE
Protein, ur: NEGATIVE mg/dL
SPECIFIC GRAVITY, URINE: 1.015 (ref 1.005–1.030)
pH: 5.5 (ref 5.0–8.0)

## 2016-05-20 LAB — TROPONIN I

## 2016-05-20 LAB — APTT: APTT: 38 s — AB (ref 24–36)

## 2016-05-20 LAB — PROTIME-INR
INR: 1.15
Prothrombin Time: 14.8 seconds (ref 11.4–15.2)

## 2016-05-20 MED ORDER — PEG 3350-KCL-NA BICARB-NACL 420 G PO SOLR
4000.0000 mL | Freq: Once | ORAL | Status: AC
  Administered 2016-05-21: 4000 mL via ORAL
  Filled 2016-05-20: qty 4000

## 2016-05-20 MED ORDER — NONFORMULARY OR COMPOUNDED ITEM
10.0000 mg | Freq: Every day | Status: DC
  Administered 2016-05-20 – 2016-05-25 (×6): 10 mg via ORAL
  Filled 2016-05-20 (×7): qty 1

## 2016-05-20 MED ORDER — NON FORMULARY
10.0000 mg | Freq: Every day | Status: DC
Start: 2016-05-20 — End: 2016-05-20

## 2016-05-20 NOTE — Consult Note (Signed)
Referring Provider:  Dr. Leodis Rains (family practice teaching service) Primary Care Physician:  No PCP Per Patient Primary Gastroenterologist:  Dr. Oletta Lamas  Reason for Consultation:  Heme positive stool and iron deficiency anemia  HPI: Jordan Johnston is a 65 y.o. male with a remote history of testicular cancer in congestive cardiomyopathy, EF 25-30%, admitted to the hospital yesterday with weakness, and found to have a hemoglobin of 6.5, ferritin 15. Hemoccult positive.  The patient underwent a colonoscopy roughly 7-10 years ago. The patient and his brother recall it being done as an outpatient at [facility], although I cannot locate any records pertaining to that procedure. It appears it was probably done by Dr. Leonie Douglas, since our office records shows a letter to the patient in 2011 recommending a colonoscopy, although no prior records have been scanned in.  The patient does not have any localizing GI tract symptoms, such as anorexia, weight loss, dysphagia, reflux, epigastric pain, nausea, constipation, diarrhea, or rectal bleeding.  Of note, however, he was on an 81 mg aspirin daily prior to admission. No other anticoagulants.  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 The patient stopped smoking about a year ago, and denies significant dyspnea, specifically stating he is able to walk a block without getting short of breath. No problem with chest pain.     Past Medical History:  Diagnosis Date  . CAD (coronary artery disease)   . CHF (congestive heart failure) ([place])   . Hernia   . History of CEA (carotid endarterectomy)    Right-sided in 2009  . History of testicular cancer    Remote, 30 yrs +  . HLD (hyperlipidemia)   . HTN (hypertension)   . Shortness of breath dyspnea     Past Surgical History:  Procedure Laterality  Date  . CARDIAC CATHETERIZATION N/A 07/27/2015   Procedure: Right/Left Heart Cath and Coronary/Graft Angiography;  Surgeon: Jettie Booze, MD;  Location: [facility];  Service: Cardiovascular;  Laterality: N/A;  . HERNIA REPAIR      Prior to Admission medications   Medication Sig Start Date End Date Taking? Authorizing Provider  aspirin 81 MG tablet Take 81 mg by mouth daily.   Yes Historical Provider, MD  atorvastatin (LIPITOR) 80 MG tablet Take 1 tablet (80 mg total) by mouth daily at 6 PM. 08/01/15  Yes Brett Canales, PA-C  ivabradine (CORLANOR) 5 MG TABS tablet Take 1 tablet (5 mg total) by mouth 2 (two) times daily with a meal. 12/21/15  Yes Amy D Clegg, NP  losartan (COZAAR) 25 MG tablet Take 0.5 tablets (12.5 mg total) by mouth daily. 08/01/15  Yes Einar Pheasant Hager, PA-C  AMBULATORY NON FORMULARY MEDICATION Take 10 mg by mouth daily. Medication Name: [place] study drug provided 01/26/16   Larey Dresser, MD    Current Facility-Administered Medications  Medication Dose Route Frequency Provider Last Rate Last Dose  . atorvastatin (LIPITOR) tablet 80 mg  80 mg Oral q1800 [facility], DO   80 mg at 05/19/16 2236  . ivabradine (CORLANOR) tablet 5 mg  5 mg Oral BID WC [facility] N Rumley, DO   5 mg at 05/20/16 0855  . losartan (COZAAR) tablet 12.5 mg  12.5 mg Oral Daily [place], DO   12.5 mg at 05/20/16 0855  . sodium chloride flush (  NS) 0.9 % injection 3 mL  3 mL Intravenous Q12H [place] N Rumley, DO   3 mL at 05/20/16 0901  . Vericiguat (Study Med)  10 mg Oral q1800 Leeanne Rio, MD        Allergies as of 05/19/2016  . (No Known Allergies)    Family History  Problem Relation Age of Onset  . Diabetes Mother   . Alzheimer's disease Mother   . Coronary artery disease Father   . Melanoma Father     Social History   Social History  . Marital status: Single    Spouse name: N/A  . Number of children: N/A  . Years of  education: N/A   Occupational History  . Not on file.   Social History Main Topics  . Smoking status: Former Smoker    Packs/day: 1.00    Years: 40.00    Types: Cigarettes    Quit date: 05/27/2015  . Smokeless tobacco: Never Used  . Alcohol use No  . Drug use: No  . Sexual activity: Not on file   Other Topics Concern  . Not on file   Social History Narrative  . No narrative on file    Review of Systems: No chest pain, no significant exertional dyspnea, no lower extremity swelling, no recent skin rashes, no noted lymphadenopathy, no urinary symptoms.  Physical Exam: Vital signs in last 24 hours: Temp:  [97.8 F (36.6 C)-98.4 F (36.9 C)] 98.2 F (36.8 C) (08/26 0612) Pulse Rate:  [64-80] 80 (08/26 0612) Resp:  [11-20] 20 (08/26 0612) BP: (93-130)/(46-71) 115/57 (08/26 0612) SpO2:  [94 %-100 %] 94 % (08/26 0612) Weight:  [79.3 kg (174 lb 14.4 oz)] 79.3 kg (174 lb 14.4 oz) (08/25 2200) Last BM Date: 05/19/16 General:   Alert,  Well-developed, well-nourished, pleasant and cooperative in NAD Head:  Normocephalic and atraumatic. Eyes:  Sclera clear, no icterus.   Conjunctiva pink. Mouth:   No ulcerations or lesions.  Oropharynx pink & moist. Neck:   No masses or thyromegaly. Lungs:  Clear throughout to auscultation.   No wheezes, crackles, or rhonchi. No evident respiratory distress. Heart:   Regular rate and rhythm; no clicks, rubs,  or gallops. 2/6 systolic murmur at the left sternal border noted, without radiation to the axilla. Abdomen:  Soft, nontender, nontympanitic, and nondistended. No masses, hepatosplenomegaly or ventral hernias noted.  Rectal:  Not performed, but has been Hemoccult positive on this admission   Msk:   Symmetrical without gross deformities. Extremities:   Without clubbing, cyanosis, or edema. Neurologic:  Alert and coherent;  grossly normal neurologically. Skin:  Intact without significant lesions or rashes. Cervical Nodes:  No significant cervical  adenopathy. Psych:   Alert and cooperative. Normal mood and affect.  Intake/Output from previous day: 08/25 0701 - 08/26 0700 In: 670 [Blood:670] Out: 500 [Urine:500] Intake/Output this shift: No intake/output data recorded.  Lab Results:  Recent Labs  05/19/16 1557 05/20/16 0625  WBC 5.7 6.4  HGB 6.5* 8.8*  HCT 21.8* 28.7*  PLT 292 265   BMET  Recent Labs  05/19/16 1557 05/20/16 0625  NA 137 137  K 4.4 4.2  CL 107 105  CO2 24 24  GLUCOSE 107* 93  BUN 23* 23*  CREATININE 1.54* 1.20  CALCIUM 8.8* 9.0   LFT  Recent Labs  05/19/16 1557  PROT 7.0  ALBUMIN 3.1*  AST 18  ALT 12*  ALKPHOS 78  BILITOT 0.6   PT/INR  Recent Labs  05/20/16  0844  LABPROT 14.8  INR 1.15    Studies/Results: No results found.  Impression: 1. Heme positive stool, on aspirin 2. Iron deficiency anemia, severe, presumably the consequence of chronic GI tract blood loss. Differential diagnosis includes aspirin induced chronic gastropathy, vascular ectasia, or neoplasia. No symptoms present to guide [place] to a specific area or a specific diagnosis. 3. History of significant congestive cardiomyopathy but without severe functional limitations (see history of present illness) 4. Remote history of testicular cancer. Apparently had some intestinal resection performed during that procedure, details not available.  Plan: Endoscopy and colonoscopy on Monday under Mac anesthesia. Petra Kuba, purpose, and risks reviewed with patient and his brother at the bedside. He is agreeable. We will do a clear liquid diet today and colonic lavage tomorrow, nothing by mouth after midnight tomorrow. If those tests are negative, consider capsule endoscopy.   LOS: 0 days   Pal Shell V  05/20/2016, 12:26 PM   Pager [phone] If no answer or after 5 PM call [phone]

## 2016-05-20 NOTE — Progress Notes (Signed)
Family Medicine Teaching Service Daily Progress Note Intern Pager: [phone]  Patient name: Jordan Johnston Medical record number: [id number] Date of birth: [birth year] Age: 65 y.o. Gender: male  Primary Care Provider: No PCP Per Patient Consultants: None Code Status: Full  Assessment and Plan: 65 y.o. male presenting with weakness with significant anemia. PMH is significant for coronary artery disease, HFrEF (EF 25-30%), history of right-sided carotid endarterectomy (2009), history of testicular cancer, hyperlipidemia. Former smoker.  # Anemia: Hemoglobin low at 6.5 at admission. Iron panel showing low iron of 31, ferritin low at 15. Vitamin B12 normal at 193. Fecal occult blood positive. Last colonoscopy in 2012.EKG with left bundle branch block; also noted on prior EKGs.  - Hemoglobin improved to 8.8 following transfusion. Continue daily CBC. -Troponin pending (initial order discontinued by nursing due to transfusion) -Urinalysis without hematuria -PT/INR, PTT pending (initial order discontinued by nursing due to transfusion) -Echocardiogram pending -Consult GI. Appreciate recommendations.  Recommend transitioning from NPO to Clear Liquid Diet  Will come by to evaluate. Anticipate need for colonoscopy and endoscopy. - Attempting to get in touch with researchers for medication (Vericiguat) to determine if there is a link to GI bleed.  # Acute Kidney Injury: Creatinine 1.20 today, improved from 1.54 at admission. Baseline 1.07. -Received 2 units of blood at admission -Continue to monitor with BMPs -If creatinine still elevated consider IV fluids. Caution with fluids given low EF.  #HFrEF: Currently prescribed Corlanor 5 mg twice daily, losartan 12.5 mg daily. Also in drug research trial for heart failure medication (Vericiguat). Echocardiogram from February 2017 with EF 25-30%, mild LVH, systolic function severely reduced, diffuse hypokinesis. -Repeat echocardiogram -Continue  Corlanor and Losartan 12.5mg  -Holding aspirin  #Prediabetes: A1c elevated to 6.1 in October 2016. Glucose elevated at 107 today. -Check A1c  FEN/GI: Nothing by mouth Prophylaxis: SCDs (concern for active bleed)  Disposition: Home  Subjective:  Reports improvement of dizziness, however notes he has not been outside to see if it is completely better. No bowel movements since admission. Denies shortness of breath, fatigue, weakness.  Objective: Temp:  [97.8 F (36.6 C)-98.4 F (36.9 C)] 98 F (36.7 C) (08/26 0045) Pulse Rate:  [64-80] 80 (08/26 0045) Resp:  [11-19] 17 (08/26 0045) BP: (93-130)/(46-71) 130/60 (08/26 0045) SpO2:  [95 %-100 %] 95 % (08/26 0045) Weight:  [174 lb 14.4 oz (79.3 kg)] 174 lb 14.4 oz (79.3 kg) (08/25 2200) Physical Exam: General: 66 year old male resting comfortably in no apparent distress Cardiovascular: S1 and S2 noted, murmur, regular rate and rhythm Respiratory: Clear to auscultation bilaterally, no increased work of breathing Abdomen: Soft and nondistended, nontender Extremities: No edema noted  Laboratory:  Recent Labs Lab 05/19/16 1557  WBC 5.7  HGB 6.5*  HCT 21.8*  PLT 292    Recent Labs Lab 05/19/16 1557  NA 137  K 4.4  CL 107  CO2 24  BUN 23*  CREATININE 1.54*  CALCIUM 8.8*  PROT 7.0  BILITOT 0.6  ALKPHOS 78  ALT 12*  AST 18  GLUCOSE 107*  - Iron Panel: Iron 31, Ferritin 15, Vitamin B12 193 - FOBT Positive  Lorna Few, DO 05/20/2016, 1:20 AM PGY-3, [place] Intern pager: [phone], text pages welcome

## 2016-05-21 ENCOUNTER — Other Ambulatory Visit (HOSPITAL_COMMUNITY): Payer: 59

## 2016-05-21 ENCOUNTER — Observation Stay (HOSPITAL_BASED_OUTPATIENT_CLINIC_OR_DEPARTMENT_OTHER): Payer: 59

## 2016-05-21 DIAGNOSIS — D500 Iron deficiency anemia secondary to blood loss (chronic): Secondary | ICD-10-CM | POA: Diagnosis not present

## 2016-05-21 DIAGNOSIS — R933 Abnormal findings on diagnostic imaging of other parts of digestive tract: Secondary | ICD-10-CM | POA: Diagnosis not present

## 2016-05-21 DIAGNOSIS — I350 Nonrheumatic aortic (valve) stenosis: Secondary | ICD-10-CM | POA: Diagnosis not present

## 2016-05-21 DIAGNOSIS — D509 Iron deficiency anemia, unspecified: Secondary | ICD-10-CM | POA: Diagnosis not present

## 2016-05-21 DIAGNOSIS — I5022 Chronic systolic (congestive) heart failure: Secondary | ICD-10-CM | POA: Diagnosis not present

## 2016-05-21 DIAGNOSIS — D508 Other iron deficiency anemias: Secondary | ICD-10-CM | POA: Diagnosis not present

## 2016-05-21 LAB — CBC
HCT: 31.5 % — ABNORMAL LOW (ref 39.0–52.0)
HEMOGLOBIN: 9.8 g/dL — AB (ref 13.0–17.0)
MCH: 27.6 pg (ref 26.0–34.0)
MCHC: 31.1 g/dL (ref 30.0–36.0)
MCV: 88.7 fL (ref 78.0–100.0)
Platelets: 283 10*3/uL (ref 150–400)
RBC: 3.55 MIL/uL — AB (ref 4.22–5.81)
RDW: 17.1 % — ABNORMAL HIGH (ref 11.5–15.5)
WBC: 5.7 10*3/uL (ref 4.0–10.5)

## 2016-05-21 LAB — BASIC METABOLIC PANEL
ANION GAP: 8 (ref 5–15)
BUN: 16 mg/dL (ref 6–20)
CALCIUM: 9 mg/dL (ref 8.9–10.3)
CHLORIDE: 104 mmol/L (ref 101–111)
CO2: 24 mmol/L (ref 22–32)
Creatinine, Ser: 1.03 mg/dL (ref 0.61–1.24)
GFR calc non Af Amer: >60 mL/min (ref >60)
Glucose, Bld: 97 mg/dL (ref 65–99)
Potassium: 4.3 mmol/L (ref 3.5–5.1)
Sodium: 136 mmol/L (ref 135–145)

## 2016-05-21 LAB — HEMOGLOBIN A1C
HEMOGLOBIN A1C: 4.8 % (ref 4.8–5.6)
Mean Plasma Glucose: 91 mg/dL

## 2016-05-21 NOTE — Progress Notes (Signed)
Family Medicine Teaching Service Daily Progress Note Intern Pager: [phone]  Patient name: Jordan Johnston Medical record number: [id number] Date of birth: Mar 08, 1951 Age: 65 y.o. Gender: male  Primary Care Provider: No PCP Per Patient Consultants: None Code Status: Full  Assessment and Plan: 65 y.o. male presenting with weakness with significant anemia. PMH is significant for coronary artery disease, HFrEF (EF 25-30%), history of right-sided carotid endarterectomy (2009), history of testicular cancer, hyperlipidemia. Former smoker.  # Anemia: Hemoglobin low at 6.5 at admission. Iron panel showing low iron of 31, ferritin low at 15. Fecal occult blood positive. Last colonoscopy in 2012.  - Hemoglobin improved to 9.8 following transfusion. Continue daily CBC. -Troponin neg, Urinalysis without hematuria, PT/INR 14.8/1.15, PTT 38 -Echocardiogram pending -Consult GI. Appreciate recommendations.  Clear Liquid Diet  Colonoscopy and endoscopy on Monday  Start bowel prep today. - Researchers for medication Risk manager) say there is no contraindication w/ possible GI bleed.  # Acute Kidney Injury: Creatinine 1.03 today, improved from 1.54 at admission. Baseline 1.07. -Received 2 units of blood at admission -Continue to monitor with BMPs -Caution with fluids given low EF.  # HFrEF: Currently prescribed Corlanor 5 mg twice daily, losartan 12.5 mg daily. Also in drug research trial for heart failure medication (Vericiguat). Echocardiogram from February 2017 with EF 25-30%, mild LVH, systolic function severely reduced, diffuse hypokinesis. -Repeat echocardiogram -Continue Corlanor and Losartan 12.5mg  -Holding aspirin  # Prediabetes: A1c elevated to 6.1 in October 2016. Glucose elevated at 107 today. -Check A1c  FEN/GI: Nothing by mouth Prophylaxis: SCDs (concern for active bleed)  Disposition: Home  Subjective:  Feeling better this AM. No issues overnight. Denies shortness of  breath, fatigue, weakness.  Objective: Temp:  [97.8 F (36.6 C)-99 F (37.2 C)] 97.8 F (36.6 C) (08/27 0626) Pulse Rate:  [72] 72 (08/27 0626) Resp:  [18-20] 18 (08/27 0626) BP: (106-116)/(48-54) 106/54 (08/27 0626) SpO2:  [96 %-98 %] 96 % (08/27 [id number]) Physical Exam: General: 65 year old male resting comfortably in no apparent distress Cardiovascular: RRR, systolic murmur present Respiratory: Clear to auscultation bilaterally, no increased work of breathing Abdomen: Soft and nondistended, nontender Extremities: No edema noted  Laboratory:  Recent Labs Lab 05/19/16 1557 05/20/16 0625 05/21/16 0531  WBC 5.7 6.4 5.7  HGB 6.5* 8.8* 9.8*  HCT 21.8* 28.7* 31.5*  PLT 292 265 283    Recent Labs Lab 05/19/16 1557 05/20/16 0625 05/21/16 0531  NA 137 137 136  K 4.4 4.2 4.3  CL 107 105 104  CO2 24 24 24   BUN 23* 23* 16  CREATININE 1.54* 1.20 1.03  CALCIUM 8.8* 9.0 9.0  PROT 7.0  --   --   BILITOT 0.6  --   --   ALKPHOS 78  --   --   ALT 12*  --   --   AST 18  --   --   GLUCOSE 107* 93 97  - Iron Panel: Iron 31, Ferritin 15, Vitamin B12 193 - FOBT Positive  Elberta Leatherwood, MD 05/21/2016, 11:34 AM PGY-3, [place] Intern pager: [phone], text pages welcome

## 2016-05-21 NOTE — Progress Notes (Signed)
  Echocardiogram 2D Echocardiogram has been performed.  Jordan Johnston 05/21/2016, 5:18 PM

## 2016-05-22 ENCOUNTER — Encounter (HOSPITAL_COMMUNITY)
Admission: EM | Disposition: A | Discharge status: Home / Self Care | Payer: Self-pay | Source: Home / Self Care | Attending: Emergency Medicine

## 2016-05-22 ENCOUNTER — Encounter (HOSPITAL_COMMUNITY): Payer: Self-pay | Admitting: *Deleted

## 2016-05-22 ENCOUNTER — Observation Stay (HOSPITAL_COMMUNITY): Payer: 59 | Admitting: Certified Registered Nurse Anesthetist

## 2016-05-22 DIAGNOSIS — D509 Iron deficiency anemia, unspecified: Secondary | ICD-10-CM

## 2016-05-22 DIAGNOSIS — Z006 Encounter for examination for normal comparison and control in clinical research program: Secondary | ICD-10-CM

## 2016-05-22 DIAGNOSIS — N179 Acute kidney failure, unspecified: Secondary | ICD-10-CM

## 2016-05-22 DIAGNOSIS — I5022 Chronic systolic (congestive) heart failure: Secondary | ICD-10-CM | POA: Diagnosis not present

## 2016-05-22 HISTORY — PX: ESOPHAGOGASTRODUODENOSCOPY (EGD) WITH PROPOFOL: SHX5813

## 2016-05-22 HISTORY — PX: COLONOSCOPY WITH PROPOFOL: SHX5780

## 2016-05-22 LAB — ECHOCARDIOGRAM COMPLETE
AV VEL mean LVOT/AV: 0.24
AV area mean vel ind: 0.35 cm2/m2
AV peak Index: 0.37
AV vel: 0.78
AVAREAMEANV: 0.69 cm2
AVAREAVTI: 0.74 cm2
AVAREAVTIIND: 0.39 cm2/m2
AVG: 23 mmHg
AVPG: 37 mmHg
AVPKVEL: 305 cm/s
Ao pk vel: 0.26 m/s
CHL CUP MV DEC (S): 359
DOP CAL AO MEAN VELOCITY: 224 cm/s
EERAT: 16.38
EWDT: 359 ms
FS: 7 % — AB (ref 28–44)
Height: 69 in
IVS/LV PW RATIO, ED: 1.21
LA ID, A-P, ES: 42 mm
LA diam index: 2.12 cm/m2
LA vol index: 43.5 mL/m2
LA vol: 86 mL
LAVOLA4C: 77.6 mL
LDCA: 2.84 cm2
LEFT ATRIUM END SYS DIAM: 42 mm
LV E/e' medial: 16.38
LV E/e'average: 16.38
LV PW d: 9.24 mm — AB (ref 0.6–1.1)
LV TDI E'LATERAL: 6.96
LV TDI E'MEDIAL: 3.92
LVELAT: 6.96 cm/s
LVOT VTI: 19.7 cm
LVOT peak vel: 79.4 cm/s
LVOTD: 19 mm
LVOTSV: 56 mL
LVOTVTI: 0.27 cm
MV Peak grad: 5 mmHg
MV pk A vel: 131 m/s
MV pk E vel: 114 m/s
RV LATERAL S' VELOCITY: 8.5 cm/s
TAPSE: 20.3 mm
VTI: 71.7 cm
Valve area index: 0.39
Valve area: 0.78 cm2
Weight: 2798.4 oz

## 2016-05-22 LAB — BASIC METABOLIC PANEL
Anion gap: 10 (ref 5–15)
BUN: 16 mg/dL (ref 6–20)
CALCIUM: 9.3 mg/dL (ref 8.9–10.3)
CO2: 28 mmol/L (ref 22–32)
CREATININE: 1.09 mg/dL (ref 0.61–1.24)
Chloride: 97 mmol/L — ABNORMAL LOW (ref 101–111)
GFR calc non Af Amer: >60 mL/min (ref >60)
GLUCOSE: 101 mg/dL — AB (ref 65–99)
Potassium: 4.4 mmol/L (ref 3.5–5.1)
Sodium: 135 mmol/L (ref 135–145)

## 2016-05-22 LAB — CBC
HEMATOCRIT: 33.6 % — AB (ref 39.0–52.0)
Hemoglobin: 10.3 g/dL — ABNORMAL LOW (ref 13.0–17.0)
MCH: 27.5 pg (ref 26.0–34.0)
MCHC: 30.7 g/dL (ref 30.0–36.0)
MCV: 89.6 fL (ref 78.0–100.0)
PLATELETS: 314 10*3/uL (ref 150–400)
RBC: 3.75 MIL/uL — ABNORMAL LOW (ref 4.22–5.81)
RDW: 17.4 % — AB (ref 11.5–15.5)
WBC: 7 10*3/uL (ref 4.0–10.5)

## 2016-05-22 SURGERY — ESOPHAGOGASTRODUODENOSCOPY (EGD) WITH PROPOFOL
Anesthesia: Monitor Anesthesia Care

## 2016-05-22 MED ORDER — PHENYLEPHRINE HCL 10 MG/ML IJ SOLN
INTRAVENOUS | Status: DC | PRN
  Administered 2016-05-22: 10 ug/min via INTRAVENOUS

## 2016-05-22 MED ORDER — PROPOFOL 10 MG/ML IV BOLUS
INTRAVENOUS | Status: DC | PRN
  Administered 2016-05-22 (×3): 10 mg via INTRAVENOUS

## 2016-05-22 MED ORDER — LACTATED RINGERS IV SOLN
INTRAVENOUS | Status: DC | PRN
  Administered 2016-05-22: 13:00:00 via INTRAVENOUS

## 2016-05-22 MED ORDER — LIDOCAINE 2% (20 MG/ML) 5 ML SYRINGE
INTRAMUSCULAR | Status: DC | PRN
  Administered 2016-05-22: 20 mg via INTRAVENOUS

## 2016-05-22 MED ORDER — PROPOFOL 500 MG/50ML IV EMUL
INTRAVENOUS | Status: DC | PRN
  Administered 2016-05-22: 50 ug/kg/min via INTRAVENOUS

## 2016-05-22 MED ORDER — SODIUM CHLORIDE 0.9 % IV SOLN: INTRAVENOUS | Status: DC

## 2016-05-22 MED ORDER — PHENYLEPHRINE HCL 10 MG/ML IJ SOLN
INTRAMUSCULAR | Status: DC | PRN
  Administered 2016-05-22 (×2): 40 ug via INTRAVENOUS

## 2016-05-22 MED ORDER — BUTAMBEN-TETRACAINE-BENZOCAINE 2-2-14 % EX AERO
INHALATION_SPRAY | CUTANEOUS | Status: DC | PRN
  Administered 2016-05-22: 2 via TOPICAL

## 2016-05-22 NOTE — Op Note (Signed)
[facility] Patient Name: Jordan Johnston Procedure Date : 05/22/2016 MRN: [id number] Attending MD: Arta Silence , MD Date of Birth: 04/17/1951 CSN: [id number] Age: 65 Admit Type: Inpatient Procedure:                Upper GI endoscopy Indications:              Acute post hemorrhagic anemia, Heme positive stool Providers:                Arta Silence, MD, Vista Lawman, RN, Corliss Parish, Technician Referring MD:              Medicines:                Monitored Anesthesia Care Complications:            No immediate complications. Estimated Blood Loss:     Estimated blood loss was minimal. Procedure:                Pre-Anesthesia Assessment:                           - Prior to the procedure, a History and Physical                            was performed, and patient medications and                            allergies were reviewed. The patient's tolerance of                            previous anesthesia was also reviewed. The risks                            and benefits of the procedure and the sedation                            options and risks were discussed with the patient.                            All questions were answered, and informed consent                            was obtained. Prior Anticoagulants: The patient has                            taken aspirin. ASA Grade Assessment: IV - A patient                            with severe systemic disease that is a constant                            threat to life. After reviewing the risks and  benefits, the patient was deemed in satisfactory                            condition to undergo the procedure.                           After obtaining informed consent, the endoscope was                            passed under direct vision. Throughout the                            procedure, the patient's blood pressure, pulse, and    oxygen saturations were monitored continuously. The                            [id number] scope was introduced through the                            mouth, and advanced to the second part of duodenum.                            The upper GI endoscopy was accomplished without                            difficulty. The patient tolerated the procedure                            well. Scope In: Scope Out: Findings:      The examined esophagus was normal.      The entire examined stomach was normal.      One deep non-bleeding cratered duodenal ulcer with pigmented material       was found in the duodenal bulb. The lesion was 12 mm in largest       dimension.      Localized moderate mucosal changes characterized by friability (with       contact bleeding), inflammation and ulceration were found in the third       portion of the duodenum. Not biopsied; unclear if mass versus vascular       anomaly, might even have been irregular-looking ampulla.      The exam of the duodenum was otherwise normal. Impression:               - Normal esophagus.                           - Normal stomach.                           - One non-bleeding duodenal ulcer with pigmented                            material.                           - Mucosal changes in the duodenum. Moderate Sedation:      None Recommendation:           -  Perform a colonoscopy today.                           - Make the patient NPO until procedures are done.                           - Continue present medications. Procedure Code(s):        --- Professional ---                           [phone], Esophagogastroduodenoscopy, flexible,                            transoral; diagnostic, including collection of                            specimen(s) by brushing or washing, when performed                            (separate procedure) Diagnosis Code(s):        --- Professional ---                           K26.9, Duodenal ulcer,  unspecified as acute or                            chronic, without hemorrhage or perforation                           K31.89, Other diseases of stomach and duodenum                           D62, Acute posthemorrhagic anemia                           R19.5, Other fecal abnormalities CPT copyright 2016 American Medical Association. All rights reserved. The codes documented in this report are preliminary and upon coder review may  be revised to meet current compliance requirements. Arta Silence, MD 05/22/2016 2:37:27 PM This report has been signed electronically. Number of Addenda: 0

## 2016-05-22 NOTE — Transfer of Care (Signed)
Immediate Anesthesia Transfer of Care Note  Patient: Jordan Johnston  Procedure(s) Performed: Procedure(s) with comments: ESOPHAGOGASTRODUODENOSCOPY (EGD) WITH PROPOFOL (N/A) - low EF COLONOSCOPY WITH PROPOFOL (N/A)  Patient Location: Endoscopy Unit  Anesthesia Type:MAC  Level of Consciousness: awake, alert , oriented and patient cooperative  Airway & Oxygen Therapy: Patient Spontanous Breathing and Patient connected to nasal cannula oxygen  Post-op Assessment: Report given to RN and Post -op Vital signs reviewed and stable  Post vital signs: Reviewed and stable  Last Vitals:  Vitals:   05/22/16 1221 05/22/16 1420  BP: 118/61 (!) 90/44  Pulse: 86 79  Resp: 16 15  Temp: 36.7 C     Last Pain:  Vitals:   05/22/16 1221  TempSrc: Oral  PainSc:          Complications: No apparent anesthesia complications

## 2016-05-22 NOTE — Anesthesia Preprocedure Evaluation (Addendum)
Anesthesia Evaluation  Patient identified by MRN, date of birth, ID band Patient awake    Reviewed: Allergy & Precautions, NPO status , Patient's Chart, lab work & pertinent test results  Airway Mallampati: II  TM Distance: >3 FB Neck ROM: Full    Dental   Pulmonary former smoker,    breath sounds clear to auscultation       Cardiovascular hypertension, Pt. on medications + CAD, + Peripheral Vascular Disease and +CHF  + Valvular Problems/Murmurs AS  Rhythm:Regular Rate:Normal  10/2015: Previous TTE with EF 25-30% ? moderate or more severe AS   Dobutamine Study     Baseline: Peak velocity 3 m/sec, mean gradient 23 mmHg, peak 38   mmHg AVA by VTI .69 by Vmax .75   Peak Dobutamine: Peak velocity 3.61 m/sec, peak gradient 52 mmHg,   mean 29 mmHg AVA by VTI .82 by Vmax .79     This would indicate that the AS is not severe. Gradient increases   at peak dobutamine infusion but   valve area same or slightly larger. Overall consistant with   moderate to severe AS   Neuro/Psych negative neurological ROS     GI/Hepatic negative GI ROS, Neg liver ROS,   Endo/Other  negative endocrine ROS  Renal/GU negative Renal ROS     Musculoskeletal   Abdominal   Peds  Hematology  (+) anemia ,   Anesthesia Other Findings   Reproductive/Obstetrics                           Anesthesia Physical Anesthesia Plan  ASA: IV  Anesthesia Plan: MAC   Post-op Pain Management:    Induction: Intravenous  Airway Management Planned: Nasal Cannula and Natural Airway  Additional Equipment:   Intra-op Plan:   Post-operative Plan:   Informed Consent: I have reviewed the patients History and Physical, chart, labs and discussed the procedure including the risks, benefits and alternatives for the proposed anesthesia with the patient or authorized representative who has indicated his/her understanding and acceptance.      Plan Discussed with: CRNA  Anesthesia Plan Comments:         Anesthesia Quick Evaluation

## 2016-05-22 NOTE — Anesthesia Procedure Notes (Signed)
Procedure Name: MAC Date/Time: 05/22/2016 1:18 PM Performed by: Everlean Cherry A Pre-anesthesia Checklist: Patient identified, Emergency Drugs available, Suction available and Patient being monitored Patient Re-evaluated:Patient Re-evaluated prior to inductionOxygen Delivery Method: Nasal cannula

## 2016-05-22 NOTE — Op Note (Signed)
[facility] Patient Name: Jordan Johnston Procedure Date : 05/22/2016 MRN: [id number] Attending MD: Arta Silence , MD Date of Birth: Apr 27, 1951 CSN: [id number] Age: 65 Admit Type: Inpatient Procedure:                Colonoscopy Indications:              Last colonoscopy: date unknown (unable to locate                            last colonoscopy report), Heme positive stool,                            Acute post hemorrhagic anemia Providers:                Arta Silence, MD, Vista Lawman, RN, Corliss Parish, Technician Referring MD:              Medicines:                Monitored Anesthesia Care Complications:            No immediate complications. Estimated Blood Loss:     Estimated blood loss was minimal. Procedure:                Pre-Anesthesia Assessment:                           - Prior to the procedure, a History and Physical                            was performed, and patient medications and                            allergies were reviewed. The patient's tolerance of                            previous anesthesia was also reviewed. The risks                            and benefits of the procedure and the sedation                            options and risks were discussed with the patient.                            All questions were answered, and informed consent                            was obtained. Prior Anticoagulants: The patient has                            taken aspirin. ASA Grade Assessment: IV - A patient  with severe systemic disease that is a constant                            threat to life. After reviewing the risks and                            benefits, the patient was deemed in satisfactory                            condition to undergo the procedure.                           - Prior to the procedure, a History and Physical                            was performed, and patient  medications and                            allergies were reviewed. The patient's tolerance of                            previous anesthesia was also reviewed. The risks                            and benefits of the procedure and the sedation                            options and risks were discussed with the patient.                            All questions were answered, and informed consent                            was obtained. Prior Anticoagulants: The patient has                            taken aspirin. ASA Grade Assessment: IV - A patient                            with severe systemic disease that is a constant                            threat to life. After reviewing the risks and                            benefits, the patient was deemed in satisfactory                            condition to undergo the procedure.                           After obtaining informed consent, the colonoscope  was passed under direct vision. Throughout the                            procedure, the patient's blood pressure, pulse, and                            oxygen saturations were monitored continuously. The                            [id number] scope was introduced through                            the anus and advanced to the the cecum, identified                            by appendiceal orifice and ileocecal valve. The                            colonoscopy was performed without difficulty. The                            patient tolerated the procedure well. The ileocecal                            valve, appendiceal orifice, and rectum were                            photographed. The colonoscopy was performed without                            difficulty. The patient tolerated the procedure                            well. The quality of the bowel preparation was good. Scope In: 1:40:33 PM Scope Out: 2:12:25 PM Scope Withdrawal Time: 0 hours 26  minutes 15 seconds  Total Procedure Duration: 0 hours 31 minutes 52 seconds  Findings:      The perianal exam findings include non-thrombosed external hemorrhoids.      Multiple sessile polyps, approximately 10-15, were found in the sigmoid       colon, transverse colon and ascending colon. The polyps were 6 to 12 mm       in size. These polyps were removed with a hot snare. Resection and       retrieval were complete.      Distal 3cm of the terminal ileum was normal. Colon otherwise normal; no       other polyps, masses, vascular ectasias, or inflammatory changes were       seen.      The retroflexed view of the distal rectum and anal verge was normal and       showed no anal or rectal abnormalities. Impression:               - Non-thrombosed external hemorrhoids found on  perianal exam.                           - Multiple 6 to 12 mm polyps in the sigmoid colon,                            in the transverse colon and in the ascending colon,                            removed with a hot snare. Resected and retrieved.                           - The distal rectum and anal verge are normal on                            retroflexion view.                           - The examination was otherwise normal. Moderate Sedation:      None Recommendation:           - Return patient to hospital ward for ongoing care.                           - Clear liquid diet today.                           - No aspirin, ibuprofen, naproxen, or other                            non-steroidal anti-inflammatory drugs for 5 days                            after polyp removal.                           - Await pathology results.                           - Repeat colonoscopy at appointment to be scheduled                            for surveillance based on pathology results.                           - Perform a CT scan (computed tomography) of                            abdomen with  contrast and pelvis with contrast for                            further evaluation, specifically to look at                            irregular ulceration at D3 and the large ulcer at  duodenal bulb, and see if there is any underlying                            pancreatic pathology.                           [facility] will follow. Procedure Code(s):        --- Professional ---                           [id number], Colonoscopy, flexible; with removal of                            tumor(s), polyp(s), or other lesion(s) by snare                            technique Diagnosis Code(s):        --- Professional ---                           D12.5, Benign neoplasm of sigmoid colon                           D12.3, Benign neoplasm of transverse colon (hepatic                            flexure or splenic flexure)                           D12.2, Benign neoplasm of ascending colon                           K64.4, Residual hemorrhoidal skin tags                           R19.5, Other fecal abnormalities                           D62, Acute posthemorrhagic anemia CPT copyright 2016 American Medical Association. All rights reserved. The codes documented in this report are preliminary and upon coder review may  be revised to meet current compliance requirements. Arta Silence, MD 05/22/2016 2:56:39 PM This report has been signed electronically. Number of Addenda: 0

## 2016-05-22 NOTE — Interval H&P Note (Signed)
History and Physical Interval Note:  05/22/2016 1:16 PM  Jordan Johnston  has presented today for surgery, with the diagnosis of anemia and heme positive stool  The various methods of treatment have been discussed with the patient and family. After consideration of risks, benefits and other options for treatment, the patient has consented to  Procedure(s) with comments: ESOPHAGOGASTRODUODENOSCOPY (EGD) WITH PROPOFOL (N/A) - low EF COLONOSCOPY WITH PROPOFOL (N/A) as a surgical intervention .  The patient's history has been reviewed, patient examined, no change in status, stable for surgery.  I have reviewed the patient's chart and labs.  Questions were answered to the patient's satisfaction.    Assessment:  1.  Anemia. 2.  Hemoccult-positive stool.  Plan:  1.  Endoscopy. 2.  Risks (bleeding, infection, bowel perforation that could require surgery, sedation-related changes in cardiopulmonary systems), benefits (identification and possible treatment of source of symptoms, exclusion of certain causes of symptoms), and alternatives (watchful waiting, radiographic imaging studies, empiric medical treatment) of upper endoscopy (EGD) were explained to patient/family in detail and patient wishes to proceed. 3.  Colonoscopy. 4.  Risks (bleeding, infection, bowel perforation that could require surgery, sedation-related changes in cardiopulmonary systems), benefits (identification and possible treatment of source of symptoms, exclusion of certain causes of symptoms), and alternatives (watchful waiting, radiographic imaging studies, empiric medical treatment) of colonoscopy were explained to patient/family in detail and patient wishes to proceed.

## 2016-05-22 NOTE — Progress Notes (Signed)
Spoke with patient, states "I came to hospital Friday evening, and was admitted. I have only been given liquids as they are trying to figure out where I'm bleeding from. I brought my study medication in with me and the pharmacist took it and have been giving it to me. The doctor checked with the study and does not think my low blood count is associated with the study medication. I got a blood transfusion and I feel much better today. They are scoping me soon to see if they can see where or if I'm still bleeding." Thanked patient for bringing in study medication. Study Coordinator will f/u with patient after endoscopy.

## 2016-05-22 NOTE — Anesthesia Postprocedure Evaluation (Signed)
Anesthesia Post Note  Patient: Jordan Johnston  Procedure(s) Performed: Procedure(s) (LRB): ESOPHAGOGASTRODUODENOSCOPY (EGD) WITH PROPOFOL (N/A) COLONOSCOPY WITH PROPOFOL (N/A)  Patient location during evaluation: PACU Anesthesia Type: MAC Level of consciousness: awake and alert Pain management: pain level controlled Vital Signs Assessment: post-procedure vital signs reviewed and stable Respiratory status: spontaneous breathing, nonlabored ventilation, respiratory function stable and patient connected to nasal cannula oxygen Cardiovascular status: stable and blood pressure returned to baseline Anesthetic complications: no    Last Vitals:  Vitals:   05/22/16 1420 05/22/16 1430  BP: (!) 90/44 (!) 114/49  Pulse: 79   Resp: 15   Temp:      Last Pain:  Vitals:   05/22/16 1221  TempSrc: Oral  PainSc:                  [name]

## 2016-05-22 NOTE — Progress Notes (Signed)
Family Medicine Teaching Service Daily Progress Note Intern Pager: [phone]  Patient name: Jordan Johnston Medical record number: [id number] Date of birth: 07-30-1951 Age: 65 y.o. Gender: male  Primary Care Provider: No PCP Per Patient Consultants: None Code Status: Full  Assessment and Plan: 65 y.o. male presenting with weakness with significant anemia. PMH is significant for coronary artery disease, HFrEF (EF 25-30%), history of right-sided carotid endarterectomy (2009), history of testicular cancer, hyperlipidemia. Former smoker.  # Anemia: Hemoglobin low at 6.5 at admission. Iron panel showing low iron of 31, ferritin low at 15. Fecal occult blood positive. Last colonoscopy in 2012.  - Hemoglobin improved to 9.8 following transfusion. Continue daily CBC. Hgb this am 10.3 -Troponin neg, Urinalysis without hematuria, PT/INR 14.8/1.15, PTT 38 -Echocardiogram pending -Consult GI. Appreciate recommendations.  Went NPO at midnight  Colonoscopy and endoscopy today - Researchers for medication Risk manager) say there is no contraindication w/ possible GI bleed.  # Acute Kidney Injury: Creatinine 1.09 today, improved from 1.54 at admission. Baseline 1.07. -Received 2 units of blood at admission -Continue to monitor with BMPs -Caution with fluids given low EF.  # HFrEF: Currently prescribed Corlanor 5 mg twice daily, losartan 12.5 mg daily. Also in drug research trial for heart failure medication (Vericiguat). Echocardiogram from February 2017 with EF 25-30%, mild LVH, systolic function severely reduced, diffuse hypokinesis. -Repeat echocardiogram, as above -Continue Corlanor and Losartan 12.5mg  -Holding aspirin  # Prediabetes: A1c elevated to 6.1 in October 2016. -A1c 4.8  FEN/GI: Nothing by mouth Prophylaxis: SCDs (concern for active bleed)  Disposition: Home. Set up in office for follow up.   Subjective:  Feeling better this morning. No issues overnight. Denies shortness of  breath, fatigue, weakness. No complaints currently.  Objective: Temp:  [97.5 F (36.4 C)-98 F (36.7 C)] 98 F (36.7 C) (08/28 1221) Pulse Rate:  [68-86] 79 (08/28 1420) Resp:  [15-18] 15 (08/28 1420) BP: (90-118)/(44-61) 114/49 (08/28 1430) SpO2:  [97 %-100 %] 99 % (08/28 1420) Physical Exam: General: 65 year old male resting comfortably in no apparent distress Cardiovascular: RRR, systolic murmur present Respiratory: Clear to auscultation bilaterally, no increased work of breathing Abdomen: Soft and nondistended, nontender Extremities: No edema noted  Laboratory:  Recent Labs Lab 05/20/16 0625 05/21/16 0531 05/22/16 0515  WBC 6.4 5.7 7.0  HGB 8.8* 9.8* 10.3*  HCT 28.7* 31.5* 33.6*  PLT 265 283 314    Recent Labs Lab 05/19/16 1557 05/20/16 0625 05/21/16 0531 05/22/16 0515  NA 137 137 136 135  K 4.4 4.2 4.3 4.4  CL 107 105 104 97*  CO2 24 24 24 28   BUN 23* 23* 16 16  CREATININE 1.54* 1.20 1.03 1.09  CALCIUM 8.8* 9.0 9.0 9.3  PROT 7.0  --   --   --   BILITOT 0.6  --   --   --   ALKPHOS 78  --   --   --   ALT 12*  --   --   --   AST 18  --   --   --   GLUCOSE 107* 93 97 101*  - Iron Panel: Iron 31, Ferritin 15, Vitamin B12 193 - FOBT Positive  Lovenia Kim, MD 05/22/2016, 4:45 PM PGY-1, [place] Intern pager: [phone], text pages welcome

## 2016-05-22 NOTE — H&P (View-Only) (Signed)
Referring Provider:  Dr. Leodis Rains (family practice teaching service) Primary Care Physician:  No PCP Per Patient Primary Gastroenterologist:  Dr. Oletta Lamas  Reason for Consultation:  Heme positive stool and iron deficiency anemia  HPI: Jordan Johnston is a 65 y.o. male with a remote history of testicular cancer in congestive cardiomyopathy, EF 25-30%, admitted to the hospital yesterday with weakness, and found to have a hemoglobin of 6.5, ferritin 15. Hemoccult positive.  The patient underwent a colonoscopy roughly 7-10 years ago. The patient and his brother recall it being done as an outpatient at [facility], although I cannot locate any records pertaining to that procedure. It appears it was probably done by Dr. Leonie Douglas, since our office records shows a letter to the patient in 2011 recommending a colonoscopy, although no prior records have been scanned in.  The patient does not have any localizing GI tract symptoms, such as anorexia, weight loss, dysphagia, reflux, epigastric pain, nausea, constipation, diarrhea, or rectal bleeding.  Of note, however, he was on an 81 mg aspirin daily prior to admission. No other anticoagulants.  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 The patient stopped smoking about a year ago, and denies significant dyspnea, specifically stating he is able to walk a block without getting short of breath. No problem with chest pain.     Past Medical History:  Diagnosis Date  . CAD (coronary artery disease)   . CHF (congestive heart failure) ([place])   . Hernia   . History of CEA (carotid endarterectomy)    Right-sided in 2009  . History of testicular cancer    Remote, 30 yrs +  . HLD (hyperlipidemia)   . HTN (hypertension)   . Shortness of breath dyspnea     Past Surgical History:  Procedure Laterality  Date  . CARDIAC CATHETERIZATION N/A 07/27/2015   Procedure: Right/Left Heart Cath and Coronary/Graft Angiography;  Surgeon: Jettie Booze, MD;  Location: [place] [facility];  Service: Cardiovascular;  Laterality: N/A;  . HERNIA REPAIR      Prior to Admission medications   Medication Sig Start Date End Date Taking? Authorizing Provider  aspirin 81 MG tablet Take 81 mg by mouth daily.   Yes Historical Provider, MD  atorvastatin (LIPITOR) 80 MG tablet Take 1 tablet (80 mg total) by mouth daily at 6 PM. 08/01/15  Yes Brett Canales, PA-C  ivabradine (CORLANOR) 5 MG TABS tablet Take 1 tablet (5 mg total) by mouth 2 (two) times daily with a meal. 12/21/15  Yes Amy D Clegg, NP  losartan (COZAAR) 25 MG tablet Take 0.5 tablets (12.5 mg total) by mouth daily. 08/01/15  Yes Einar Pheasant Hager, PA-C  AMBULATORY NON FORMULARY MEDICATION Take 10 mg by mouth daily. Medication Name: [place] study drug provided 01/26/16   Larey Dresser, MD    Current Facility-Administered Medications  Medication Dose Route Frequency Provider Last Rate Last Dose  . atorvastatin (LIPITOR) tablet 80 mg  80 mg Oral q1800 [facility], DO   80 mg at 05/19/16 2236  . ivabradine (CORLANOR) tablet 5 mg  5 mg Oral BID WC [place] N Rumley, DO   5 mg at 05/20/16 0855  . losartan (COZAAR) tablet 12.5 mg  12.5 mg Oral Daily [facility], DO   12.5 mg at 05/20/16 0855  . sodium chloride flush (  NS) 0.9 % injection 3 mL  3 mL Intravenous Q12H [place] N Rumley, DO   3 mL at 05/20/16 0901  . Vericiguat (Study Med)  10 mg Oral q1800 Leeanne Rio, MD        Allergies as of 05/19/2016  . (No Known Allergies)    Family History  Problem Relation Age of Onset  . Diabetes Mother   . Alzheimer's disease Mother   . Coronary artery disease Father   . Melanoma Father     Social History   Social History  . Marital status: Single    Spouse name: N/A  . Number of children: N/A  . Years of  education: N/A   Occupational History  . Not on file.   Social History Main Topics  . Smoking status: Former Smoker    Packs/day: 1.00    Years: 40.00    Types: Cigarettes    Quit date: 05/27/2015  . Smokeless tobacco: Never Used  . Alcohol use No  . Drug use: No  . Sexual activity: Not on file   Other Topics Concern  . Not on file   Social History Narrative  . No narrative on file    Review of Systems: No chest pain, no significant exertional dyspnea, no lower extremity swelling, no recent skin rashes, no noted lymphadenopathy, no urinary symptoms.  Physical Exam: Vital signs in last 24 hours: Temp:  [97.8 F (36.6 C)-98.4 F (36.9 C)] 98.2 F (36.8 C) (08/26 0612) Pulse Rate:  [64-80] 80 (08/26 0612) Resp:  [11-20] 20 (08/26 0612) BP: (93-130)/(46-71) 115/57 (08/26 0612) SpO2:  [94 %-100 %] 94 % (08/26 0612) Weight:  [79.3 kg (174 lb 14.4 oz)] 79.3 kg (174 lb 14.4 oz) (08/25 2200) Last BM Date: 05/19/16 General:   Alert,  Well-developed, well-nourished, pleasant and cooperative in NAD Head:  Normocephalic and atraumatic. Eyes:  Sclera clear, no icterus.   Conjunctiva pink. Mouth:   No ulcerations or lesions.  Oropharynx pink & moist. Neck:   No masses or thyromegaly. Lungs:  Clear throughout to auscultation.   No wheezes, crackles, or rhonchi. No evident respiratory distress. Heart:   Regular rate and rhythm; no clicks, rubs,  or gallops. 2/6 systolic murmur at the left sternal border noted, without radiation to the axilla. Abdomen:  Soft, nontender, nontympanitic, and nondistended. No masses, hepatosplenomegaly or ventral hernias noted.  Rectal:  Not performed, but has been Hemoccult positive on this admission   Msk:   Symmetrical without gross deformities. Extremities:   Without clubbing, cyanosis, or edema. Neurologic:  Alert and coherent;  grossly normal neurologically. Skin:  Intact without significant lesions or rashes. Cervical Nodes:  No significant cervical  adenopathy. Psych:   Alert and cooperative. Normal mood and affect.  Intake/Output from previous day: 08/25 0701 - 08/26 0700 In: 670 [Blood:670] Out: 500 [Urine:500] Intake/Output this shift: No intake/output data recorded.  Lab Results:  Recent Labs  05/19/16 1557 05/20/16 0625  WBC 5.7 6.4  HGB 6.5* 8.8*  HCT 21.8* 28.7*  PLT 292 265   BMET  Recent Labs  05/19/16 1557 05/20/16 0625  NA 137 137  K 4.4 4.2  CL 107 105  CO2 24 24  GLUCOSE 107* 93  BUN 23* 23*  CREATININE 1.54* 1.20  CALCIUM 8.8* 9.0   LFT  Recent Labs  05/19/16 1557  PROT 7.0  ALBUMIN 3.1*  AST 18  ALT 12*  ALKPHOS 78  BILITOT 0.6   PT/INR  Recent Labs  05/20/16  0844  LABPROT 14.8  INR 1.15    Studies/Results: No results found.  Impression: 1. Heme positive stool, on aspirin 2. Iron deficiency anemia, severe, presumably the consequence of chronic GI tract blood loss. Differential diagnosis includes aspirin induced chronic gastropathy, vascular ectasia, or neoplasia. No symptoms present to guide [place] to a specific area or a specific diagnosis. 3. History of significant congestive cardiomyopathy but without severe functional limitations (see history of present illness) 4. Remote history of testicular cancer. Apparently had some intestinal resection performed during that procedure, details not available.  Plan: Endoscopy and colonoscopy on Monday under Mac anesthesia. Petra Kuba, purpose, and risks reviewed with patient and his brother at the bedside. He is agreeable. We will do a clear liquid diet today and colonic lavage tomorrow, nothing by mouth after midnight tomorrow. If those tests are negative, consider capsule endoscopy.   LOS: 0 days   Edwin Baines V  05/20/2016, 12:26 PM   Pager [phone] If no answer or after 5 PM call [phone]

## 2016-05-23 ENCOUNTER — Observation Stay (HOSPITAL_COMMUNITY): Payer: 59

## 2016-05-23 ENCOUNTER — Encounter (HOSPITAL_COMMUNITY): Payer: Self-pay | Admitting: Gastroenterology

## 2016-05-23 DIAGNOSIS — Z8547 Personal history of malignant neoplasm of testis: Secondary | ICD-10-CM | POA: Diagnosis not present

## 2016-05-23 DIAGNOSIS — N179 Acute kidney failure, unspecified: Secondary | ICD-10-CM | POA: Diagnosis not present

## 2016-05-23 DIAGNOSIS — D508 Other iron deficiency anemias: Secondary | ICD-10-CM | POA: Diagnosis not present

## 2016-05-23 DIAGNOSIS — R933 Abnormal findings on diagnostic imaging of other parts of digestive tract: Secondary | ICD-10-CM | POA: Diagnosis not present

## 2016-05-23 DIAGNOSIS — D500 Iron deficiency anemia secondary to blood loss (chronic): Secondary | ICD-10-CM | POA: Diagnosis not present

## 2016-05-23 LAB — CBC
HEMATOCRIT: 34.3 % — AB (ref 39.0–52.0)
HEMOGLOBIN: 10.4 g/dL — AB (ref 13.0–17.0)
MCH: 27.4 pg (ref 26.0–34.0)
MCHC: 30.3 g/dL (ref 30.0–36.0)
MCV: 90.3 fL (ref 78.0–100.0)
Platelets: 287 10*3/uL (ref 150–400)
RBC: 3.8 MIL/uL — AB (ref 4.22–5.81)
RDW: 17.7 % — ABNORMAL HIGH (ref 11.5–15.5)
WBC: 6.9 10*3/uL (ref 4.0–10.5)

## 2016-05-23 LAB — BASIC METABOLIC PANEL
ANION GAP: 11 (ref 5–15)
BUN: 16 mg/dL (ref 6–20)
CALCIUM: 8.8 mg/dL — AB (ref 8.9–10.3)
CHLORIDE: 99 mmol/L — AB (ref 101–111)
CO2: 23 mmol/L (ref 22–32)
CREATININE: 0.98 mg/dL (ref 0.61–1.24)
Glucose, Bld: 97 mg/dL (ref 65–99)
Potassium: 4.3 mmol/L (ref 3.5–5.1)
Sodium: 133 mmol/L — ABNORMAL LOW (ref 135–145)

## 2016-05-23 MED ORDER — IOPAMIDOL (ISOVUE-300) INJECTION 61%
INTRAVENOUS | Status: AC
  Administered 2016-05-23: 100 mL
  Filled 2016-05-23: qty 100

## 2016-05-23 MED ORDER — IOPAMIDOL (ISOVUE-300) INJECTION 61%
INTRAVENOUS | Status: AC
  Filled 2016-05-23: qty 30

## 2016-05-23 NOTE — Consult Note (Signed)
Reason for Referral: Abdominal mass.   HPI: 65 year old gentleman with history of coronary artery disease and hypertension who was hospitalized on 05/19/2016 with symptoms of weakness and anemia. His hemoglobin was 6.5 and low iron with a ferritin of 15. He had heme occult positive testing and his workup included an endoscopy showed normal esophagus and stomach but a deep nonbleeding duodenal ulcer noted. This was not biopsied and it was unclear if it was mass versus vascular abnormalities. He also had a colonoscopy on 05/22/2016 which showed sterile hemorrhoids and multiple 6-12 mm polyps in the sigmoid colon. No other malignancy noted. He received packed red cell transfusion with hemoglobin responding up to 10.4 and currently stable without any evidence of decline.  CT scan of the abdomen and pelvis obtained on 05/23/2016 showed a 6 cm calcified mass along the porta-caval area with mass effect on the duodenum and the pancreas. This is invading the wall of the duodenum and likely accounting for the endoscopy findings and possible ulcer.    At this time, he reports no other complaints. He felt well and is able to tolerate liquid diet without any concerns. He denied any nausea, vomiting or abdominal pain. He denied any change in his bowel habits. He reports he had testicular cancer over 30 years ago and had surgery with an orchiectomy and no recurrence of his cancer at this time.  He is not report any headaches, blurry vision, syncope or seizures. He does not report any fevers or chills or sweats. He does not report any cough, wheezing or hemoptysis. He does not report any nausea, vomiting or abdominal pain. He does not report any frequency urgency or hesitancy. He does not report any skeletal complaints. Remaining review of systems unremarkable.   Past Medical History:  Diagnosis Date  . CAD (coronary artery disease)   . CHF (congestive heart failure) ([place])   . Hernia   . History of CEA (carotid  endarterectomy)    Right-sided in 2009  . History of testicular cancer    Remote, 30 yrs +  . HLD (hyperlipidemia)   . HTN (hypertension)   . Shortness of breath dyspnea   :  Past Surgical History:  Procedure Laterality Date  . CARDIAC CATHETERIZATION N/A 07/27/2015   Procedure: Right/Left Heart Cath and Coronary/Graft Angiography;  Surgeon: Jettie Booze, MD;  Location: [place] [facility];  Service: Cardiovascular;  Laterality: N/A;  . HERNIA REPAIR    :   Current Facility-Administered Medications:  .  atorvastatin (LIPITOR) tablet 80 mg, 80 mg, Oral, q1800, [place] N Rumley, DO, 80 mg at 05/22/16 1741 .  ivabradine (CORLANOR) tablet 5 mg, 5 mg, Oral, BID WC, [place] N Rumley, DO, 5 mg at 05/23/16 1025 .  losartan (COZAAR) tablet 12.5 mg, 12.5 mg, Oral, Daily, [place] N Rumley, DO, 12.5 mg at 05/23/16 1025 .  sodium chloride flush (NS) 0.9 % injection 3 mL, 3 mL, Intravenous, Q12H, [place] N Rumley, DO, 3 mL at 05/22/16 2020 .  Vericiguat (Study Med), 10 mg, Oral, q1800, Leeanne Rio, MD, 10 mg at 05/22/16 1742:  No Known Allergies:  Family History  Problem Relation Age of Onset  . Diabetes Mother   . Alzheimer's disease Mother   . Coronary artery disease Father   . Melanoma Father   :  Social History   Social History  . Marital status: Single    Spouse name: N/A  . Number of children: N/A  . Years of education: N/A  Occupational History  . Not on file.   Social History Main Topics  . Smoking status: Former Smoker    Packs/day: 1.00    Years: 40.00    Types: Cigarettes    Quit date: 05/27/2015  . Smokeless tobacco: Never Used  . Alcohol use No  . Drug use: No  . Sexual activity: Not on file   Other Topics Concern  . Not on file   Social History Narrative  . No narrative on file  :  Pertinent items are noted in HPI.  Exam: Blood pressure (!) 98/52, pulse 74, temperature 97.8 F (36.6 C), temperature source Oral, resp. rate 19, height 5\' 9"   (1.753 m), weight 174 lb 14.4 oz (79.3 kg), SpO2 96 %. General appearance: alert and cooperative appeared without distress. Head: Normocephalic, without obvious abnormality Throat: No oral thrush noted. Neck: no adenopathy Back: negative Resp: clear to auscultation bilaterally no dullness to percussion. Chest wall: no tenderness Cardio: regular rate and rhythm, S1, S2 normal, no murmur, click, rub or gallop GI: soft, non-tender; bowel sounds normal; no masses,  no organomegaly no shifting dullness or ascites. Extremities: extremities normal, atraumatic, no cyanosis or edema  Pulses: 2+ and symmetric Lymph nodes: Cervical, supraclavicular, and axillary nodes normal.   Recent Labs  05/22/16 0515 05/23/16 0618  WBC 7.0 6.9  HGB 10.3* 10.4*  HCT 33.6* 34.3*  PLT 314 287    Recent Labs  05/22/16 0515 05/23/16 0618  NA 135 133*  K 4.4 4.3  CL 97* 99*  CO2 28 23  GLUCOSE 101* 97  BUN 16 16  CREATININE 1.09 0.98  CALCIUM 9.3 8.8*      Ct Abdomen Pelvis W Contrast  Result Date: 05/23/2016 CLINICAL DATA:  GI bleed with abnormal upper endoscopy. Mucosal ulceration noted along the third portion of the duodenum. History of testicular cancer. EXAM: CT ABDOMEN AND PELVIS WITH CONTRAST TECHNIQUE: Multidetector CT imaging of the abdomen and pelvis was performed using the standard protocol following bolus administration of intravenous contrast. CONTRAST:  165mL ISOVUE-300 IOPAMIDOL (ISOVUE-300) INJECTION 61% COMPARISON:  None FINDINGS: Lower chest: The lung bases are clear acute process. No pleural effusion. Two small subpleural left lower lobe pulmonary nodules are noted. These were present on the prior CT scan of the chest. The heart is normal in size. No pericardial effusion. Coronary artery calcifications are noted. The distal esophagus is grossly normal. Hepatobiliary: No focal hepatic lesions or intrahepatic biliary dilatation. The gallbladder is normal. No common bile duct dilatation.  Pancreas: There is mass effect on the head of the pancreas but I do not see a discrete pancreatic mass. The head and uncinate process are post anteriorly by a retroperitoneal mass. No common bile duct or pancreatic duct dilatation. Spleen: Normal size.  No focal lesions. Adrenals/Urinary Tract: The adrenal glands and kidneys are unremarkable. Stomach/Bowel: The stomach is unremarkable. There is mass effect on the duodenum with possible direct invasion of a large calcified portal caval mass/adenopathy. This measures approximately 6.8 x 4.7 cm. There are surgical clips in this area and I suspect there was a prior lymph node dissection and this is recurrent testicular cancer. The small bowel and colon are unremarkable. The terminal ileum is normal. Vascular/Lymphatic: Followup within the large calcified probable nodal mass I do not see any other lymphadenopathy. Advanced atherosclerotic calcifications involving the aorta and iliac arteries. No aneurysm. Other: Small amount of free pelvic fluid. The bladder, prostate gland and seminal vesicles are unremarkable. It appears the patient has  had a right orchiectomy. No inguinal mass or adenopathy. Musculoskeletal: No significant bony findings. No worrisome bone lesions. IMPRESSION: 1. 6 cm calcified portal caval mass with mass effect on the duodenum and pancreas. This is likely invading the medial wall of duodenum, accounting for the endoscopy findings. 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 2. No other areas of metastatic disease are identified. 3. Advanced atherosclerotic calcifications involving the aorta and iliac arteries. Electronically Signed   By: Marijo Sanes M.D.   On: 05/23/2016 10:32    Assessment and Plan:   65 year old gentleman with the following issues:  1. 6.8 x 4.7 cm calcified mass noted on a CT scan with a mass effect on the duodenum with surgical clips noted in the  area around a possible lymph node dissection that he had prior. He presented with iron deficiency anemia from a bleeding ulcer caused by this mass.  He has remote history of testicular cancer although this pattern of recurrence is very unusual for testicular cancer. The differential diagnosis includes GI malignancy, metastatic melanoma, lymphoma, metastatic testicular cancer among others.  The next step of management would be to obtain tissue biopsy. This could be obtained by percutaneous biopsy by interventional radiology. If this cannot be obtained, repeat endoscopy and biopsy without ulcer might be indicated. I also recommend repeating CT scan of the chest to complete staging workup.  Once the pathology is available, further recommendation would be given at that time.   2. Iron deficiency anemia: Related to blood loss from a bleeding ulcer. His hemoglobin stable and no further intervention is needed.  3. History of testicular cancer: He appeared to have surgery remotely and no further information is available to me regarding this diagnosis.   I'll follow his progress while he is hospitalized and once tissue biopsies obtained, I will discuss these results with the patient which could be done as an outpatient if he continues to be stable.

## 2016-05-23 NOTE — Progress Notes (Signed)
Subjective: No abdominal pain.  Objective: Vital signs in last 24 hours: Temp:  [97.5 F (36.4 C)-98 F (36.7 C)] 97.7 F (36.5 C) (08/29 0432) Pulse Rate:  [76-86] 76 (08/29 1020) Resp:  [15-18] 18 (08/29 0432) BP: (90-118)/(44-61) 100/52 (08/29 1020) SpO2:  [95 %-100 %] 100 % (08/29 1020) Weight change:  Last BM Date: 05/22/16  PE: GEN: Chronically ill-appearing, NAD ABD:  Soft, non-tender  Lab Results: CBC    Component Value Date/Time   WBC 6.9 05/23/2016 0618   RBC 3.80 (L) 05/23/2016 0618   HGB 10.4 (L) 05/23/2016 0618   HCT 34.3 (L) 05/23/2016 0618   PLT 287 05/23/2016 0618   MCV 90.3 05/23/2016 0618   MCH 27.4 05/23/2016 0618   MCHC 30.3 05/23/2016 0618   RDW 17.7 (H) 05/23/2016 0618   LYMPHSABS 1.0 07/26/2015 1410   MONOABS 0.5 07/26/2015 1410   EOSABS 0.0 07/26/2015 1410   BASOSABS 0.0 07/26/2015 1410   CMP     Component Value Date/Time   NA 133 (L) 05/23/2016 0618   K 4.3 05/23/2016 0618   CL 99 (L) 05/23/2016 0618   CO2 23 05/23/2016 0618   GLUCOSE 97 05/23/2016 0618   BUN 16 05/23/2016 0618   CREATININE 0.98 05/23/2016 0618   CALCIUM 8.8 (L) 05/23/2016 0618   PROT 7.0 05/19/2016 1557   ALBUMIN 3.1 (L) 05/19/2016 1557   AST 18 05/19/2016 1557   ALT 12 (L) 05/19/2016 1557   ALKPHOS 78 05/19/2016 1557   BILITOT 0.6 05/19/2016 1557   GFRNONAA >60 05/23/2016 0618   GFRAA >60 05/23/2016 0618   Assessment:  1.  Anemia, with hemoccult positive stool.  Ulcerated region in duodenum, didn't look like routine peptic ulcer, but likely accounting for anemia. 2.  Hemoccult-positive stool, etiology likely as above.  No overt bleeding.  Plan:  1.  CT scan today to assess for underlying pancreatic pathology. 2.  Next step in management pending CT results.   Landry Dyke 05/23/2016, 11:10 AM   Pager [phone] If no answer or after 5 PM call [phone]

## 2016-05-23 NOTE — Progress Notes (Signed)
Patient's BP 98/50, patient asymptomatic. MD notified. No new orders at this time. Will continue to monitor BP.

## 2016-05-23 NOTE — Progress Notes (Signed)
Family Medicine Teaching Service Daily Progress Note Intern Pager: [phone]  Patient name: Jordan Johnston Medical record number: [id number] Date of birth: [birth year] Age: 65 y.o. Gender: male  Primary Care Provider: No PCP Per Patient Consultants: None Code Status: Full  Assessment and Plan: 65 y.o. male presenting with weakness with significant anemia. PMH is significant for coronary artery disease, HFrEF (EF 25-30%), history of right-sided carotid endarterectomy (2009), history of testicular cancer, hyperlipidemia. Former smoker.  # Anemia: Hemoglobin low at 6.5 at admission. Iron panel showing low iron of 31, ferritin low at 15. Fecal occult blood positive. Last colonoscopy in 2012.  - Hemoglobin improved to 9.8 following transfusion. Continue daily CBC. Hgb this am 10.4 -Troponin neg, Urinalysis without hematuria, PT/INR 14.8/1.15, PTT 38 -Echocardiogram 8/27 - compared to a prior study in 10/2015, the LVEF is slightly higher at 30-35%. -Consult GI. Appreciate recommendations. -See colonoscopy and EGD results below. -CT A/P showing 1. 6 cm calcified portal caval mass with mass effect on the duodenum and pancreas. Likely invading the medial wall of duodenum, accounting for the endoscopy findings. 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 -Will consult oncology today  # Acute Kidney Injury: Creatinine 0.98 today, improved from 1.54 at admission. Baseline 1.07. -Received 2 units of blood at admission -Continue to monitor with BMPs -Caution with fluids given low EF.  # HFrEF: Currently prescribed Corlanor 5 mg twice daily, losartan 12.5 mg daily. Also in drug research trial for heart failure medication (Vericiguat). Echocardiogram from February 2017 with EF 25-30%, mild LVH, systolic function severely reduced, diffuse hypokinesis. -Continue Corlanor and Losartan 12.5mg  -Holding aspirin  # Prediabetes: A1c elevated to 6.1 in October 2016. -A1c 4.8  FEN/GI: Nothing by  mouth Prophylaxis: SCDs (concern for active bleed)  Disposition: Home. Set up in office for follow up.   Subjective:  Feeling better this morning. No issues overnight. Denies shortness of breath, fatigue, weakness. No complaints currently.  Objective: Temp:  [97.5 F (36.4 C)-98 F (36.7 C)] 97.7 F (36.5 C) (08/29 0432) Pulse Rate:  [77-86] 79 (08/29 0432) Resp:  [15-18] 18 (08/29 0432) BP: (90-118)/(44-61) 101/54 (08/29 0432) SpO2:  [95 %-99 %] 95 % (08/29 0432) Physical Exam: General: 65 year old male resting comfortably in no apparent distress Cardiovascular: RRR, systolic murmur present Respiratory: Clear to auscultation bilaterally, no increased work of breathing Abdomen: Soft and nondistended, nontender Extremities: No edema noted  Laboratory:  Recent Labs Lab 05/21/16 0531 05/22/16 0515 05/23/16 0618  WBC 5.7 7.0 6.9  HGB 9.8* 10.3* 10.4*  HCT 31.5* 33.6* 34.3*  PLT 283 314 287    Recent Labs Lab 05/19/16 1557  05/21/16 0531 05/22/16 0515 05/23/16 0618  NA 137  < > 136 135 133*  K 4.4  < > 4.3 4.4 4.3  CL 107  < > 104 97* 99*  CO2 24  < > 24 28 23   BUN 23*  < > 16 16 16   CREATININE 1.54*  < > 1.03 1.09 0.98  CALCIUM 8.8*  < > 9.0 9.3 8.8*  PROT 7.0  --   --   --   --   BILITOT 0.6  --   --   --   --   ALKPHOS 78  --   --   --   --   ALT 12*  --   --   --   --   AST 18  --   --   --   --   GLUCOSE 107*  < >  97 101* 97  < > = values in this interval not displayed.- Iron Panel: Iron 31, Ferritin 15, Vitamin B12 193 - FOBT Positive  Colonoscopy 8/28 showing non-thrombosed external hemorrhoids, multiple 6 to 12 mm polyps in the sigmoid colon, in the transverse colon and in the ascending colon, removed with a hot snare. Resected and retrieved. Recommendations: clear liquid diet. Avoid aspirin, ibuprofen, naproxen or nsaids x5days after polyp removal. Pathology pending. Repeat colonoscopy at appointment to be scheduled for surveillance based on pathology  results. CT abdomen pelvis with contrast for further evaluation, specifically to look at irregular ulceration at D3 and the large ulcer at duodenal bulb, and see if there is any underlying pancreatic pathology. [name] will follow. EGD 8/28 One deep non-bleeding cratered duodenal ulcer with pigmented material was found in the duodenal bulb. The lesion was 12 mm in largest dimension. Localized moderate mucosal changes characterized by friability (with contact bleeding), inflammation and ulceration were found in the third portion of the duodenum. Not biopsied; unclear if mass versus vascular anomaly, might even have been irregular-looking ampulla.  Ct Abdomen Pelvis W Contrast  Result Date: 05/23/2016 CLINICAL DATA:  GI bleed with abnormal upper endoscopy. Mucosal ulceration noted along the third portion of the duodenum. History of testicular cancer. EXAM: CT ABDOMEN AND PELVIS WITH CONTRAST TECHNIQUE: Multidetector CT imaging of the abdomen and pelvis was performed using the standard protocol following bolus administration of intravenous contrast. CONTRAST:  133mL ISOVUE-300 IOPAMIDOL (ISOVUE-300) INJECTION 61% COMPARISON:  None FINDINGS: Lower chest: The lung bases are clear acute process. No pleural effusion. Two small subpleural left lower lobe pulmonary nodules are noted. These were present on the prior CT scan of the chest. The heart is normal in size. No pericardial effusion. Coronary artery calcifications are noted. The distal esophagus is grossly normal. Hepatobiliary: No focal hepatic lesions or intrahepatic biliary dilatation. The gallbladder is normal. No common bile duct dilatation. Pancreas: There is mass effect on the head of the pancreas but I do not see a discrete pancreatic mass. The head and uncinate process are post anteriorly by a retroperitoneal mass. No common bile duct or pancreatic duct dilatation. Spleen: Normal size.  No focal lesions. Adrenals/Urinary Tract: The adrenal glands and  kidneys are unremarkable. Stomach/Bowel: The stomach is unremarkable. There is mass effect on the duodenum with possible direct invasion of a large calcified portal caval mass/adenopathy. This measures approximately 6.8 x 4.7 cm. There are surgical clips in this area and I suspect there was a prior lymph node dissection and this is recurrent testicular cancer. The small bowel and colon are unremarkable. The terminal ileum is normal. Vascular/Lymphatic: Followup within the large calcified probable nodal mass I do not see any other lymphadenopathy. Advanced atherosclerotic calcifications involving the aorta and iliac arteries. No aneurysm. Other: Small amount of free pelvic fluid. The bladder, prostate gland and seminal vesicles are unremarkable. It appears the patient has had a right orchiectomy. No inguinal mass or adenopathy. Musculoskeletal: No significant bony findings. No worrisome bone lesions. IMPRESSION: 1. 6 cm calcified portal caval mass with mass effect on the duodenum and pancreas. This is likely invading the medial wall of duodenum, accounting for the endoscopy findings. 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 2. No other areas of metastatic disease are identified. 3. Advanced atherosclerotic calcifications involving the aorta and iliac arteries. Electronically Signed   By: Marijo Sanes M.D.   On: 05/23/2016 10:32  Lovenia Kim, MD 05/23/2016, 9:51 AM PGY-1, [place] Intern pager: [phone], text pages welcome

## 2016-05-23 NOTE — Progress Notes (Signed)
MD paged to make aware of CT results and to update that patient's brother/caregiver Coralee Pesa is asking for updates. Patient agreeable to give updates to brother. Ronny's cell [phone]

## 2016-05-24 ENCOUNTER — Encounter (HOSPITAL_COMMUNITY): Payer: Self-pay | Admitting: Radiology

## 2016-05-24 ENCOUNTER — Observation Stay (HOSPITAL_COMMUNITY): Payer: 59

## 2016-05-24 DIAGNOSIS — R933 Abnormal findings on diagnostic imaging of other parts of digestive tract: Secondary | ICD-10-CM | POA: Diagnosis not present

## 2016-05-24 DIAGNOSIS — N179 Acute kidney failure, unspecified: Secondary | ICD-10-CM | POA: Diagnosis not present

## 2016-05-24 DIAGNOSIS — D509 Iron deficiency anemia, unspecified: Secondary | ICD-10-CM | POA: Diagnosis not present

## 2016-05-24 DIAGNOSIS — C801 Malignant (primary) neoplasm, unspecified: Secondary | ICD-10-CM

## 2016-05-24 LAB — CBC
HEMATOCRIT: 30.2 % — AB (ref 39.0–52.0)
Hemoglobin: 9.3 g/dL — ABNORMAL LOW (ref 13.0–17.0)
MCH: 27.1 pg (ref 26.0–34.0)
MCHC: 30.8 g/dL (ref 30.0–36.0)
MCV: 88 fL (ref 78.0–100.0)
Platelets: 275 10*3/uL (ref 150–400)
RBC: 3.43 MIL/uL — AB (ref 4.22–5.81)
RDW: 17.5 % — ABNORMAL HIGH (ref 11.5–15.5)
WBC: 7.4 10*3/uL (ref 4.0–10.5)

## 2016-05-24 LAB — BASIC METABOLIC PANEL
ANION GAP: 10 (ref 5–15)
BUN: 24 mg/dL — ABNORMAL HIGH (ref 6–20)
CALCIUM: 8.5 mg/dL — AB (ref 8.9–10.3)
CHLORIDE: 98 mmol/L — AB (ref 101–111)
CO2: 25 mmol/L (ref 22–32)
Creatinine, Ser: 1.21 mg/dL (ref 0.61–1.24)
GFR calc Af Amer: >60 mL/min (ref >60)
GFR calc non Af Amer: >60 mL/min (ref >60)
Glucose, Bld: 106 mg/dL — ABNORMAL HIGH (ref 65–99)
Potassium: 4 mmol/L (ref 3.5–5.1)
Sodium: 133 mmol/L — ABNORMAL LOW (ref 135–145)

## 2016-05-24 MED ORDER — IOPAMIDOL (ISOVUE-300) INJECTION 61%
INTRAVENOUS | Status: AC
  Administered 2016-05-24: 75 mL
  Filled 2016-05-24: qty 75

## 2016-05-24 NOTE — Progress Notes (Signed)
Subjective: No abdominal pain. No overt GI bleeding.  Objective: Vital signs in last 24 hours: Temp:  [97.8 F (36.6 C)-98 F (36.7 C)] 98 F (36.7 C) (08/30 0619) Pulse Rate:  [72-79] 79 (08/30 0619) Resp:  [18-19] 18 (08/30 0619) BP: (68-111)/(31-52) 95/52 (08/30 0803) SpO2:  [94 %-100 %] 94 % (08/30 [id number]) Weight change:  Last BM Date: 05/24/16  PE: GEN:  NAD, chronically ill-appearing. ABD:  Soft, non-tender  Lab Results: CBC    Component Value Date/Time   WBC 7.4 05/24/2016 0404   RBC 3.43 (L) 05/24/2016 0404   HGB 9.3 (L) 05/24/2016 0404   HCT 30.2 (L) 05/24/2016 0404   PLT 275 05/24/2016 0404   MCV 88.0 05/24/2016 0404   MCH 27.1 05/24/2016 0404   MCHC 30.8 05/24/2016 0404   RDW 17.5 (H) 05/24/2016 0404   LYMPHSABS 1.0 07/26/2015 1410   MONOABS 0.5 07/26/2015 1410   EOSABS 0.0 07/26/2015 1410   BASOSABS 0.0 07/26/2015 1410   CMP     Component Value Date/Time   NA 133 (L) 05/24/2016 0404   K 4.0 05/24/2016 0404   CL 98 (L) 05/24/2016 0404   CO2 25 05/24/2016 0404   GLUCOSE 106 (H) 05/24/2016 0404   BUN 24 (H) 05/24/2016 0404   CREATININE 1.21 05/24/2016 0404   CALCIUM 8.5 (L) 05/24/2016 0404   PROT 7.0 05/19/2016 1557   ALBUMIN 3.1 (L) 05/19/2016 1557   AST 18 05/19/2016 1557   ALT 12 (L) 05/19/2016 1557   ALKPHOS 78 05/19/2016 1557   BILITOT 0.6 05/19/2016 1557   GFRNONAA >60 05/24/2016 0404   GFRAA >60 05/24/2016 0404   Assessment:  1.  Anemia, with hemoccult positive stool.  Ulcerated region in duodenum, didn't look like routine peptic ulcer, but likely accounting for anemia. 2.  Hemoccult-positive stool, etiology likely as above.  No overt bleeding. 3.  Portocaval mass, suspicious for recurrent testicular cancer, with suspected duodenal involvement.  Plan:  1.  Interventional radiology doesn't see appropriate window for biopsy of the portocaval mass. 2.  Accordingly, will plan for endoscopic ultrasound hopefully Friday, will involve  [facility] to [place] for now as tolerated. 4.  [name] will follow.    Landry Dyke 05/24/2016, 9:59 AM   Pager [phone] If no answer or after 5 PM call [phone]

## 2016-05-24 NOTE — Progress Notes (Signed)
Family Medicine Teaching Service Daily Progress Note Intern Pager: [phone]  Patient name: Jordan Johnston Medical record number: [id number] Date of birth: 03-Oct-1950 Age: 65 y.o. Gender: male  Primary Care Provider: No PCP Per Patient Consultants: None Code Status: Full  Assessment and Plan: 65 y.o. male presenting with weakness with significant anemia. PMH is significant for coronary artery disease, HFrEF (EF 25-30%), history of right-sided carotid endarterectomy (2009), history of testicular cancer, hyperlipidemia. Former smoker.  # Anemia: Hemoglobin low at 6.5 at admission. Iron panel showing low iron of 31, ferritin low at 15. Fecal occult blood positive. Last colonoscopy in 2012. Echocardiogram 8/27 - compared to a prior study in 10/2015, the LVEF is slightly higher at 30-35%. - Hemoglobin improved to 9.8 following transfusion. Hgb this am 9.3 from 10.4 yesterday.   -Colonoscopy and EGD performed.  Results below. -Continue daily CBC  #Hx of testicular cancer -CT A/P showing 1. 6 cm calcified portal caval mass suspicious for recurrent metastatic testicular cancer, with suspected duodenal involvement. -Oncology consulted, appreciate recs: rec obtain tissue biopsy by percutaneous IR. If cannot be obtained repeat endoscopy and biopsy without ulcer.  -CT chest ordered to complete staging workup -IR consulted >> no appropriate window for biopsy of the portocaval mass -GI rec: Will plan for repeat endoscopy hopefully Friday, will involve [facility]. Per GI recs, keep him admitted due to ulcer with high risk for bleeding. -Advance diet as tolerated for now. NPO on Friday 00:00 midnight. -[name] will follow  # Acute Kidney Injury: Creatinine 1.21 today, improved from 1.54 at admission. Baseline 1.07. -Continue to monitor with BMPs -Caution with fluids given low EF.  # HFrEF: Currently prescribed Corlanor 5 mg twice daily, losartan 12.5 mg daily. Also in drug  research trial for heart failure medication (Vericiguat). Echocardiogram from February 2017 with EF 25-30%, mild LVH, systolic function severely reduced, diffuse hypokinesis. -Continue Corlanor and Losartan 12.5mg  -Holding aspirin  # Prediabetes: A1c elevated to 6.1 in October 2016. -A1c 4.8  FEN/GI: Advance diet as tolerated. Prophylaxis: SCDs  Disposition: Home. Set up in office for follow up.   Subjective:  Feeling better this morning No issues overnight. Denies shortness of breath, fatigue, weakness. No complaints currently.  Objective: Temp:  [97.8 F (36.6 C)-98 F (36.7 C)] 98 F (36.7 C) (08/30 0619) Pulse Rate:  [72-79] 79 (08/30 0619) Resp:  [18-19] 18 (08/30 0619) BP: (68-111)/(31-52) 95/52 (08/30 0803) SpO2:  [94 %-100 %] 94 % (08/30 [id number]) Physical Exam: General: 65 year old male resting comfortably in no apparent distress Cardiovascular: RRR, systolic murmur present Respiratory: Clear to auscultation bilaterally, no increased work of breathing Abdomen: Soft and nondistended, nontender Extremities: No edema noted  Laboratory:  Recent Labs Lab 05/22/16 0515 05/23/16 0618 05/24/16 0404  WBC 7.0 6.9 7.4  HGB 10.3* 10.4* 9.3*  HCT 33.6* 34.3* 30.2*  PLT 314 287 275    Recent Labs Lab 05/19/16 1557  05/22/16 0515 05/23/16 0618 05/24/16 0404  NA 137  < > 135 133* 133*  K 4.4  < > 4.4 4.3 4.0  CL 107  < > 97* 99* 98*  CO2 24  < > 28 23 25   BUN 23*  < > 16 16 24*  CREATININE 1.54*  < > 1.09 0.98 1.21  CALCIUM 8.8*  < > 9.3 8.8* 8.5*  PROT 7.0  --   --   --   --   BILITOT 0.6  --   --   --   --  ALKPHOS 78  --   --   --   --   ALT 12*  --   --   --   --   AST 18  --   --   --   --   GLUCOSE 107*  < > 101* 97 106*  < > = values in this interval not displayed.- Iron Panel: Iron 31, Ferritin 15, Vitamin B12 193 - FOBT Positive  Colonoscopy 8/28 showing non-thrombosed external hemorrhoids, multiple 6 to 12 mm polyps in the sigmoid colon, in the  transverse colon and in the ascending colon, removed with a hot snare. Resected and retrieved. Recommendations: clear liquid diet. Avoid aspirin, ibuprofen, naproxen or nsaids x5days after polyp removal. Pathology pending. Repeat colonoscopy at appointment to be scheduled for surveillance based on pathology results. CT abdomen pelvis with contrast for further evaluation, specifically to look at irregular ulceration at D3 and the large ulcer at duodenal bulb, and see if there is any underlying pancreatic pathology. [name] will follow. EGD 8/28 One deep non-bleeding cratered duodenal ulcer with pigmented material was found in the duodenal bulb. The lesion was 12 mm in largest dimension. Localized moderate mucosal changes characterized by friability (with contact bleeding), inflammation and ulceration were found in the third portion of the duodenum. Not biopsied; unclear if mass versus vascular anomaly, might even have been irregular-looking ampulla.  Ct Abdomen Pelvis W Contrast  Result Date: 05/23/2016 CLINICAL DATA:  GI bleed with abnormal upper endoscopy. Mucosal ulceration noted along the third portion of the duodenum. History of testicular cancer. EXAM: CT ABDOMEN AND PELVIS WITH CONTRAST TECHNIQUE: Multidetector CT imaging of the abdomen and pelvis was performed using the standard protocol following bolus administration of intravenous contrast. CONTRAST:  147mL ISOVUE-300 IOPAMIDOL (ISOVUE-300) INJECTION 61% COMPARISON:  None FINDINGS: Lower chest: The lung bases are clear acute process. No pleural effusion. Two small subpleural left lower lobe pulmonary nodules are noted. These were present on the prior CT scan of the chest. The heart is normal in size. No pericardial effusion. Coronary artery calcifications are noted. The distal esophagus is grossly normal. Hepatobiliary: No focal hepatic lesions or intrahepatic biliary dilatation. The gallbladder is normal. No common bile duct dilatation. Pancreas:  There is mass effect on the head of the pancreas but I do not see a discrete pancreatic mass. The head and uncinate process are post anteriorly by a retroperitoneal mass. No common bile duct or pancreatic duct dilatation. Spleen: Normal size.  No focal lesions. Adrenals/Urinary Tract: The adrenal glands and kidneys are unremarkable. Stomach/Bowel: The stomach is unremarkable. There is mass effect on the duodenum with possible direct invasion of a large calcified portal caval mass/adenopathy. This measures approximately 6.8 x 4.7 cm. There are surgical clips in this area and I suspect there was a prior lymph node dissection and this is recurrent testicular cancer. The small bowel and colon are unremarkable. The terminal ileum is normal. Vascular/Lymphatic: Followup within the large calcified probable nodal mass I do not see any other lymphadenopathy. Advanced atherosclerotic calcifications involving the aorta and iliac arteries. No aneurysm. Other: Small amount of free pelvic fluid. The bladder, prostate gland and seminal vesicles are unremarkable. It appears the patient has had a right orchiectomy. No inguinal mass or adenopathy. Musculoskeletal: No significant bony findings. No worrisome bone lesions. IMPRESSION: 1. 6 cm calcified portal caval mass with mass effect on the duodenum and pancreas. This is likely invading the medial wall of duodenum, accounting for the endoscopy findings. 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 2. No other areas of metastatic disease are identified. 3. Advanced atherosclerotic calcifications involving the aorta and iliac arteries. Electronically Signed   By: Marijo Sanes M.D.   On: 05/23/2016 10:32   Lovenia Kim, MD 05/24/2016, 8:10 AM PGY-1, [place] Intern pager: [phone], text pages welcome

## 2016-05-25 DIAGNOSIS — D500 Iron deficiency anemia secondary to blood loss (chronic): Secondary | ICD-10-CM | POA: Diagnosis not present

## 2016-05-25 DIAGNOSIS — N179 Acute kidney failure, unspecified: Secondary | ICD-10-CM | POA: Diagnosis not present

## 2016-05-25 DIAGNOSIS — C801 Malignant (primary) neoplasm, unspecified: Secondary | ICD-10-CM | POA: Diagnosis not present

## 2016-05-25 DIAGNOSIS — R933 Abnormal findings on diagnostic imaging of other parts of digestive tract: Secondary | ICD-10-CM | POA: Diagnosis not present

## 2016-05-25 LAB — BASIC METABOLIC PANEL
ANION GAP: 8 (ref 5–15)
BUN: 25 mg/dL — ABNORMAL HIGH (ref 6–20)
CHLORIDE: 99 mmol/L — AB (ref 101–111)
CO2: 25 mmol/L (ref 22–32)
Calcium: 8.8 mg/dL — ABNORMAL LOW (ref 8.9–10.3)
Creatinine, Ser: 1.14 mg/dL (ref 0.61–1.24)
GFR calc Af Amer: >60 mL/min (ref >60)
GFR calc non Af Amer: >60 mL/min (ref >60)
GLUCOSE: 102 mg/dL — AB (ref 65–99)
POTASSIUM: 4.1 mmol/L (ref 3.5–5.1)
Sodium: 132 mmol/L — ABNORMAL LOW (ref 135–145)

## 2016-05-25 LAB — CBC
HEMATOCRIT: 29.7 % — AB (ref 39.0–52.0)
Hemoglobin: 9.1 g/dL — ABNORMAL LOW (ref 13.0–17.0)
MCH: 27.1 pg (ref 26.0–34.0)
MCHC: 30.6 g/dL (ref 30.0–36.0)
MCV: 88.4 fL (ref 78.0–100.0)
Platelets: 292 10*3/uL (ref 150–400)
RBC: 3.36 MIL/uL — AB (ref 4.22–5.81)
RDW: 17.5 % — ABNORMAL HIGH (ref 11.5–15.5)
WBC: 8 10*3/uL (ref 4.0–10.5)

## 2016-05-25 NOTE — Progress Notes (Signed)
Family Medicine Teaching Service Daily Progress Note Intern Pager: [phone]  Patient name: Jordan Johnston Medical record number: [id number] Date of birth: 1951-05-05 Age: 65 y.o. Gender: male  Primary Care Provider: No PCP Per Patient Consultants: None Code Status: Full  Assessment and Plan: 65 y.o. male presenting with weakness with significant anemia. PMH is significant for coronary artery disease, HFrEF (EF 25-30%), history of right-sided carotid endarterectomy (2009), history of testicular cancer, hyperlipidemia. Former smoker.  # Anemia: Hemoglobin low at 6.5 at admission. Iron panel showing low iron of 31, ferritin low at 15. Fecal occult blood positive. Last colonoscopy in 2012. Echocardiogram 8/27 - compared to a prior study in 10/2015, the LVEF is slightly higher at 30-35%. -Hemoglobin improved to 9.8 following transfusion. Hgb stable this am 9.1 from 9.3 yesterday. -Colonoscopy and EGD performed.  Results below. -Continue daily CBC  #Hx of testicular cancer. 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 Oncology consulted, rec obtain tissue biopsy by percutaneous IR. IR saw no appropriate window for biopsy of portocaval mass.  -CT chest performed to complete staging workup >> negative for metastatic dx, 2 subpleural nodes in left chest (unchanged to smaller compared to 2013 study and benign). -GI plans for repeat endoscopy hopefully Friday, will [facility]. Per GI recs, keep him admitted due to ulcer with high risk for bleeding. Plan to transfer back to [facility] after endoscopic procedure. -NPO midnight tonight -[name] will follow  # Acute Kidney Injury: Creatinine 1.14 today, improved from 1.54 at admission. Baseline 1.07. -Continue to monitor with BMPs -Caution with fluids given low EF.  # HFrEF: Currently prescribed Corlanor 5 mg twice daily, losartan 12.5  mg daily. Also in drug research trial for heart failure medication (Vericiguat). Echocardiogram from February 2017 with EF 25-30%, mild LVH, systolic function severely reduced, diffuse hypokinesis. -Continue Corlanor and Losartan 12.5mg  -Holding aspirin  # Prediabetes: A1c elevated to 6.1 in October 2016. -A1c 4.8  FEN/GI: Advance diet as tolerated. Prophylaxis: SCDs  Disposition: [facility] Friday am for endoscopy and biopsy. Is set up with [facility] for hospital follow-up appointment on 9/7.  Subjective:  Feeling better this morning. No issues overnight. Denies shortness of breath, fatigue, weakness. No complaints currently.  Objective: Temp:  [98.2 F (36.8 C)-98.5 F (36.9 C)] 98.2 F (36.8 C) (08/31 0504) Pulse Rate:  [71-80] 80 (08/31 0504) Resp:  [17-20] 17 (08/31 0504) BP: (93-111)/(48-54) 111/50 (08/31 0504) SpO2:  [95 %-99 %] 95 % (08/31 0504) Physical Exam: General: 65 year old male resting comfortably in no apparent distress Cardiovascular: RRR, systolic murmur present Respiratory: Clear to auscultation bilaterally, no increased work of breathing Abdomen: Soft and nondistended, nontender Extremities: No edema noted  Laboratory:  Recent Labs Lab 05/23/16 0618 05/24/16 0404 05/25/16 0226  WBC 6.9 7.4 8.0  HGB 10.4* 9.3* 9.1*  HCT 34.3* 30.2* 29.7*  PLT 287 275 292    Recent Labs Lab 05/19/16 1557  05/23/16 0618 05/24/16 0404 05/25/16 0226  NA 137  < > 133* 133* 132*  K 4.4  < > 4.3 4.0 4.1  CL 107  < > 99* 98* 99*  CO2 24  < > 23 25 25   BUN 23*  < > 16 24* 25*  CREATININE 1.54*  < > 0.98 1.21 1.14  CALCIUM 8.8*  < > 8.8* 8.5* 8.8*  PROT 7.0  --   --   --   --  BILITOT 0.6  --   --   --   --   ALKPHOS 78  --   --   --   --   ALT 12*  --   --   --   --   AST 18  --   --   --   --   GLUCOSE 107*  < > 97 106* 102*  < > = values in this interval not displayed.- Iron Panel: Iron 31, Ferritin 15, Vitamin B12 193 - FOBT Positive  Colonoscopy 8/28  showing non-thrombosed external hemorrhoids, multiple 6 to 12 mm polyps in the sigmoid colon, in the transverse colon and in the ascending colon, removed with a hot snare. Resected and retrieved. Recommendations: clear liquid diet. Avoid aspirin, ibuprofen, naproxen or nsaids x5days after polyp removal. Pathology pending. Repeat colonoscopy at appointment to be scheduled for surveillance based on pathology results. CT abdomen pelvis with contrast for further evaluation, specifically to look at irregular ulceration at D3 and the large ulcer at duodenal bulb, and see if there is any underlying pancreatic pathology. [name] will follow. EGD 8/28 One deep non-bleeding cratered duodenal ulcer with pigmented material was found in the duodenal bulb. The lesion was 12 mm in largest dimension. Localized moderate mucosal changes characterized by friability (with contact bleeding), inflammation and ulceration were found in the third portion of the duodenum. Not biopsied; unclear if mass versus vascular anomaly, might even have been irregular-looking ampulla.  Ct Chest W Contrast  Result Date: 05/24/2016 CLINICAL DATA:  Patient with history of testicular carcinoma with possible recurrent disease in the abdomen and pelvis. EXAM: CT CHEST WITH CONTRAST TECHNIQUE: Multidetector CT imaging of the chest was performed during intravenous contrast administration. CONTRAST:  75 ml ISOVUE-300 IOPAMIDOL (ISOVUE-300) INJECTION 61% COMPARISON:  CT chest 07/29/2015 and 05/05/2012. CT abdomen and pelvis 05/23/2016. FINDINGS: Cardiovascular: Extensive calcific coronary artery disease is identified. Scattered aortic atherosclerosis is also seen. Heart size is upper normal. No pericardial effusion. Pericardial calcification posteriorly about the left ventricle is unchanged. Mediastinum/Nodes: 1.4 x 1.4 cm right hilar lymph node on image 39 is unchanged since 2013. No new or enlarging lymph nodes are identified in the axilla, hila or  mediastinum. Lungs/Pleura: Scattered pleural-parenchymal scarring and centrilobular and paraseptal emphysematous disease are identified as on the prior exams. A 0.3 cm subpleural nodule in the lingula on image 172 and a 0.4 cm subpleural nodule in the left lower lobe on image 82 are stable in size to slightly smaller. No new or enlarging pulmonary nodule is identified. There is some peribronchial thickening. Upper Abdomen: For findings regarding the upper abdomen, please refer to report of dedicated CT abdomen and pelvis yesterday. Musculoskeletal: No lytic or sclerotic lesion is identified. Mild appearing thoracic spondylosis is noted. IMPRESSION: Negative for metastatic disease. Extensive calcific coronary atherosclerosis. Scattered aortic atherosclerosis also noted. Paraseptal and centrilobular emphysema and chronic bronchitic change consistent with COPD. Two subpleural nodules in the left chest are unchanged to smaller compared to the 2013 study and consistent with benignity. Electronically Signed   By: Inge Rise M.D.   On: 05/24/2016 13:15   Jordan Kim, MD 05/25/2016, 8:41 AM PGY-1, [place] Intern pager: [phone], text pages welcome

## 2016-05-25 NOTE — Discharge Summary (Signed)
[facility] Discharge Summary  Patient name: Jordan Johnston Medical record number: [id number] Date of birth: 07-Jun-1951 Age: 65 y.o. Gender: male Date of Admission: 05/19/2016  Date of Discharge: 05/26/16  Admitting Physician: Leeanne Rio, MD  Primary Care Provider: No PCP Per Patient Consultants: GI, Oncology, Cardiology  Indication for Hospitalization: weakness, anemia  Discharge Diagnoses/Problem List:  Patient Active Problem List   Diagnosis Date Noted  . Malignancy ([place])   . Abnormal colonoscopy   . Acute kidney injury ([place])   . Anemia 05/19/2016  . Hyperlipemia 09/14/2015  . Chronic systolic congestive heart failure ([place]) 08/25/2015  . Former smoker 08/10/2015  . CAD (coronary atherosclerotic disease) 08/10/2015  . Acute on chronic systolic congestive heart failure ([place])   . Aortic stenosis   . CHF (congestive heart failure) ([place]) 07/26/2015  . Lung nodule 05/06/2012  . Hypomagnesemia 05/06/2012  . Systolic murmur [id number]  . Abnormal x-ray of lung 05/05/2012  . Pre-syncope 05/05/2012  . Carotid artery disease ([place]) 05/05/2012   Disposition: Home  Discharge Condition: Stable  Discharge Exam:  General: elderly male resting comfortably in no apparent distress Cardiovascular: RRR, systolic murmur present Respiratory: Clear to auscultation bilaterally, no increased work of breathing Abdomen: Soft and nondistended, nontender Extremities: No edema noted  Brief Hospital Course:  Jordan Johnston is a 65 y.o. male presenting with weakness with significant anemia. PMH is significant for coronary artery disease, HFrEF (EF 25-30%), history of right-sided carotid endarterectomy (2009), history of testicular cancer, hyperlipidemia. Former smoker.  Patient was admitted with Hgb 6.5 and transfused. FOBT+.  Colonoscopy and EGD performed to look for source of bleed.  Colonoscopy showing multiple 6-5mm polyps which were resected and retrieved.   EGD showing one deep non-bleeding cratered duodenal ulcer with pigmented material.  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    Oncology consulted. CT chest performed to complete staging workup. Showed negative for metastatic disease, 2 subpleural nodes in left chest (unchanged to smaller compared to 2013 study and benign).  Oncology also recommended obtaining tissue biopsy by percutaneous IR. IR saw no appropriate window for biopsy of portocaval mass. GI planned to repeat endoscopy Friday, 9/1, involving [facility] and back to [facility]. The procedure went well. 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  Biopsies done, preliminary cytology shows malignant cells, further studies required for differentiation. Will follow up with oncology after discharge to discuss final results.   Issues for Follow Up:  1. Repeat HgB at follow up appointment, patient required 1 PRBC transfusion 2. Attempted to contact Oncology to set up follow up appointment, was unable to reach them but their note indicated they would follow up with him about results as an outpatient. Make sure he has follow up with oncology scheduled. 3. Patient should be set up to see GI as outpatient  Significant Procedures: Endoscopy with biopsy  Significant Labs and Imaging:   Recent Labs Lab 05/24/16 0404 05/25/16 0226 05/26/16 0612  WBC 7.4 8.0 6.6  HGB 9.3* 9.1* 9.1*  HCT 30.2* 29.7* 30.2*  PLT 275 292 281    Recent Labs Lab 05/22/16 0515 05/23/16 0618 05/24/16 0404 05/25/16 0226 05/26/16 0612  NA 135 133* 133* 132* 133*  K 4.4 4.3 4.0 4.1 4.2  CL 97* 99* 98* 99* 102  CO2 28 23 25 25 23   GLUCOSE 101* 97  106* 102* 93  BUN 16 16 24* 25* 22*  CREATININE 1.09 0.98 1.21 1.14 1.03  CALCIUM 9.3 8.8* 8.5* 8.8* 8.7*    Ct Chest W Contrast  Result Date:  05/24/2016 CLINICAL DATA:  Patient with history of testicular carcinoma with possible recurrent disease in the abdomen and pelvis. EXAM: CT CHEST WITH CONTRAST TECHNIQUE: Multidetector CT imaging of the chest was performed during intravenous contrast administration. CONTRAST:  75 ml ISOVUE-300 IOPAMIDOL (ISOVUE-300) INJECTION 61% COMPARISON:  CT chest 07/29/2015 and 05/05/2012. CT abdomen and pelvis 05/23/2016. FINDINGS: Cardiovascular: Extensive calcific coronary artery disease is identified. Scattered aortic atherosclerosis is also seen. Heart size is upper normal. No pericardial effusion. Pericardial calcification posteriorly about the left ventricle is unchanged. Mediastinum/Nodes: 1.4 x 1.4 cm right hilar lymph node on image 39 is unchanged since 2013. No new or enlarging lymph nodes are identified in the axilla, hila or mediastinum. Lungs/Pleura: Scattered pleural-parenchymal scarring and centrilobular and paraseptal emphysematous disease are identified as on the prior exams. A 0.3 cm subpleural nodule in the lingula on image 172 and a 0.4 cm subpleural nodule in the left lower lobe on image 82 are stable in size to slightly smaller. No new or enlarging pulmonary nodule is identified. There is some peribronchial thickening. Upper Abdomen: For findings regarding the upper abdomen, please refer to report of dedicated CT abdomen and pelvis yesterday. Musculoskeletal: No lytic or sclerotic lesion is identified. Mild appearing thoracic spondylosis is noted. IMPRESSION: Negative for metastatic disease. Extensive calcific coronary atherosclerosis. Scattered aortic atherosclerosis also noted. Paraseptal and centrilobular emphysema and chronic bronchitic change consistent with COPD. Two subpleural nodules in the left chest are unchanged to smaller compared to the 2013 study and consistent with benignity. Electronically Signed   By: Inge Rise M.D.   On: 05/24/2016 13:15   Ct Abdomen Pelvis W Contrast  Result  Date: 05/23/2016 CLINICAL DATA:  GI bleed with abnormal upper endoscopy. Mucosal ulceration noted along the third portion of the duodenum. History of testicular cancer. EXAM: CT ABDOMEN AND PELVIS WITH CONTRAST TECHNIQUE: Multidetector CT imaging of the abdomen and pelvis was performed using the standard protocol following bolus administration of intravenous contrast. CONTRAST:  158mL ISOVUE-300 IOPAMIDOL (ISOVUE-300) INJECTION 61% COMPARISON:  None FINDINGS: Lower chest: The lung bases are clear acute process. No pleural effusion. Two small subpleural left lower lobe pulmonary nodules are noted. These were present on the prior CT scan of the chest. The heart is normal in size. No pericardial effusion. Coronary artery calcifications are noted. The distal esophagus is grossly normal. Hepatobiliary: No focal hepatic lesions or intrahepatic biliary dilatation. The gallbladder is normal. No common bile duct dilatation. Pancreas: There is mass effect on the head of the pancreas but I do not see a discrete pancreatic mass. The head and uncinate process are post anteriorly by a retroperitoneal mass. No common bile duct or pancreatic duct dilatation. Spleen: Normal size.  No focal lesions. Adrenals/Urinary Tract: The adrenal glands and kidneys are unremarkable. Stomach/Bowel: The stomach is unremarkable. There is mass effect on the duodenum with possible direct invasion of a large calcified portal caval mass/adenopathy. This measures approximately 6.8 x 4.7 cm. There are surgical clips in this area and I suspect there was a prior lymph node dissection and this is recurrent testicular cancer. The small bowel and colon are unremarkable. The terminal ileum is normal. Vascular/Lymphatic: Followup within the large calcified probable nodal mass I do not see any other lymphadenopathy. Advanced atherosclerotic calcifications involving the aorta and iliac  arteries. No aneurysm. Other: Small amount of free pelvic fluid. The bladder,  prostate gland and seminal vesicles are unremarkable. It appears the patient has had a right orchiectomy. No inguinal mass or adenopathy. Musculoskeletal: No significant bony findings. No worrisome bone lesions. IMPRESSION: 1. 6 cm calcified portal caval mass with mass effect on the duodenum and pancreas. This is likely invading the medial wall of duodenum, accounting for the endoscopy findings. 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 2. No other areas of metastatic disease are identified. 3. Advanced atherosclerotic calcifications involving the aorta and iliac arteries. Electronically Signed   By: Marijo Sanes M.D.   On: 05/23/2016 10:32    Results/Tests Pending at Time of Discharge: path results  Discharge Medications:    Medication List    TAKE these medications   AMBULATORY NON FORMULARY MEDICATION Take 10 mg by mouth daily. Medication Name: [place] study drug provided   aspirin 81 MG tablet Take 81 mg by mouth daily.   atorvastatin 80 MG tablet Commonly known as:  LIPITOR Take 1 tablet (80 mg total) by mouth daily at 6 PM.   ivabradine 5 MG Tabs tablet Commonly known as:  CORLANOR Take 1 tablet (5 mg total) by mouth 2 (two) times daily with a meal.   losartan 25 MG tablet Commonly known as:  COZAAR Take 0.5 tablets (12.5 mg total) by mouth daily.       Discharge Instructions: Please refer to Patient Instructions section of EMR for full details.  Patient was counseled important signs and symptoms that should prompt return to medical care, changes in medications, dietary instructions, activity restrictions, and follow up appointments.   Follow-Up Appointments: Follow-up Information    Zigmund Gottron, MD Follow up on 06/01/2016.   Specialty:  Family Medicine Why:  Appointment at 8.30 AM with Dr. Andria Frames. Please arrive at 8.15 AM. Contact information: [place] [id number] [place], DO 05/26/2016, 4:24 PM PGY-1, [place]

## 2016-05-25 NOTE — Progress Notes (Signed)
Patient for EUS-guided biopsy of portocaval abnormality tomorrow afternoon at 1 pm.  Barring any complications from procedure, patient could likely be discharged home tomorrow post-procedure with outpatient oncology and PCP follow-up.

## 2016-05-26 ENCOUNTER — Other Ambulatory Visit (HOSPITAL_COMMUNITY): Payer: Self-pay | Admitting: Cardiology

## 2016-05-26 ENCOUNTER — Encounter (HOSPITAL_COMMUNITY): Payer: Self-pay | Admitting: Certified Registered Nurse Anesthetist

## 2016-05-26 ENCOUNTER — Encounter (HOSPITAL_COMMUNITY)
Admission: EM | Disposition: A | Discharge status: Home / Self Care | Payer: Self-pay | Source: Home / Self Care | Attending: Emergency Medicine

## 2016-05-26 ENCOUNTER — Observation Stay (HOSPITAL_COMMUNITY): Payer: 59 | Admitting: Certified Registered Nurse Anesthetist

## 2016-05-26 DIAGNOSIS — N179 Acute kidney failure, unspecified: Secondary | ICD-10-CM | POA: Diagnosis not present

## 2016-05-26 DIAGNOSIS — D500 Iron deficiency anemia secondary to blood loss (chronic): Secondary | ICD-10-CM | POA: Diagnosis not present

## 2016-05-26 DIAGNOSIS — C801 Malignant (primary) neoplasm, unspecified: Secondary | ICD-10-CM | POA: Diagnosis not present

## 2016-05-26 DIAGNOSIS — R933 Abnormal findings on diagnostic imaging of other parts of digestive tract: Secondary | ICD-10-CM | POA: Diagnosis not present

## 2016-05-26 HISTORY — PX: EUS: SHX5427

## 2016-05-26 LAB — CBC
HEMATOCRIT: 30.2 % — AB (ref 39.0–52.0)
HEMOGLOBIN: 9.1 g/dL — AB (ref 13.0–17.0)
MCH: 26.8 pg (ref 26.0–34.0)
MCHC: 30.1 g/dL (ref 30.0–36.0)
MCV: 89.1 fL (ref 78.0–100.0)
Platelets: 281 10*3/uL (ref 150–400)
RBC: 3.39 MIL/uL — ABNORMAL LOW (ref 4.22–5.81)
RDW: 17.4 % — ABNORMAL HIGH (ref 11.5–15.5)
WBC: 6.6 10*3/uL (ref 4.0–10.5)

## 2016-05-26 LAB — BASIC METABOLIC PANEL
Anion gap: 8 (ref 5–15)
BUN: 22 mg/dL — ABNORMAL HIGH (ref 6–20)
CHLORIDE: 102 mmol/L (ref 101–111)
CO2: 23 mmol/L (ref 22–32)
CREATININE: 1.03 mg/dL (ref 0.61–1.24)
Calcium: 8.7 mg/dL — ABNORMAL LOW (ref 8.9–10.3)
GFR calc non Af Amer: >60 mL/min (ref >60)
GLUCOSE: 93 mg/dL (ref 65–99)
Potassium: 4.2 mmol/L (ref 3.5–5.1)
Sodium: 133 mmol/L — ABNORMAL LOW (ref 135–145)

## 2016-05-26 SURGERY — UPPER ENDOSCOPIC ULTRASOUND (EUS) LINEAR
Anesthesia: Monitor Anesthesia Care | Laterality: Left

## 2016-05-26 MED ORDER — CIPROFLOXACIN IN D5W 400 MG/200ML IV SOLN: 400.0000 mg | Freq: Once | INTRAVENOUS | Status: DC

## 2016-05-26 MED ORDER — CIPROFLOXACIN IN D5W 400 MG/200ML IV SOLN
INTRAVENOUS | Status: AC
  Filled 2016-05-26: qty 200

## 2016-05-26 MED ORDER — LIDOCAINE 2% (20 MG/ML) 5 ML SYRINGE
INTRAMUSCULAR | Status: AC
  Filled 2016-05-26: qty 5

## 2016-05-26 MED ORDER — PROPOFOL 10 MG/ML IV BOLUS
INTRAVENOUS | Status: AC
Start: 2016-05-26 — End: 2016-05-26
  Filled 2016-05-26: qty 20

## 2016-05-26 MED ORDER — PHENYLEPHRINE HCL 10 MG/ML IJ SOLN
INTRAVENOUS | Status: DC | PRN
  Administered 2016-05-26: 30 ug/min via INTRAVENOUS

## 2016-05-26 MED ORDER — LIDOCAINE 2% (20 MG/ML) 5 ML SYRINGE
INTRAMUSCULAR | Status: DC | PRN
  Administered 2016-05-26 (×2): 50 mg via INTRAVENOUS

## 2016-05-26 MED ORDER — PROPOFOL 10 MG/ML IV BOLUS
INTRAVENOUS | Status: DC | PRN
  Administered 2016-05-26 (×7): 10 mg via INTRAVENOUS
  Administered 2016-05-26: 15 mg via INTRAVENOUS

## 2016-05-26 MED ORDER — PHENYLEPHRINE HCL 10 MG/ML IJ SOLN
INTRAMUSCULAR | Status: AC
  Filled 2016-05-26: qty 1

## 2016-05-26 MED ORDER — PHENYLEPHRINE 40 MCG/ML (10ML) SYRINGE FOR IV PUSH (FOR BLOOD PRESSURE SUPPORT)
PREFILLED_SYRINGE | INTRAVENOUS | Status: DC | PRN
  Administered 2016-05-26 (×2): 40 ug via INTRAVENOUS
  Administered 2016-05-26: 80 ug via INTRAVENOUS

## 2016-05-26 MED ORDER — PROPOFOL 500 MG/50ML IV EMUL
INTRAVENOUS | Status: DC | PRN
  Administered 2016-05-26: 50 ug/kg/min via INTRAVENOUS

## 2016-05-26 MED ORDER — LACTATED RINGERS IV SOLN
INTRAVENOUS | Status: DC
  Administered 2016-05-26: 13:00:00 via INTRAVENOUS

## 2016-05-26 MED ORDER — SODIUM CHLORIDE 0.9 % IV SOLN: INTRAVENOUS | Status: DC

## 2016-05-26 MED ORDER — PROPOFOL 10 MG/ML IV BOLUS
INTRAVENOUS | Status: AC
  Filled 2016-05-26: qty 20

## 2016-05-26 NOTE — Transfer of Care (Signed)
Immediate Anesthesia Transfer of Care Note  Patient: Jordan Johnston  Procedure(s) Performed: Procedure(s): UPPER ENDOSCOPIC ULTRASOUND (EUS) LINEAR (Left)  Patient Location: PACU  Anesthesia Type:MAC  Level of Consciousness: Patient easily awoken, sedated, comfortable, cooperative, following commands, responds to stimulation.   Airway & Oxygen Therapy: Patient spontaneously breathing, ventilating well, oxygen via simple oxygen mask.  Post-op Assessment: Report given to PACU RN, vital signs reviewed and stable, moving all extremities.   Post vital signs: Reviewed and stable.  Complications: No apparent anesthesia complications Last Vitals:  Vitals:   05/26/16 1134 05/26/16 1409  BP: (!) 121/44 (!) 111/47  Pulse: 69 65  Resp: 17 16  Temp: 36.4 C     Last Pain:  Vitals:   05/26/16 1134  TempSrc: Oral  PainSc:          Complications: No apparent anesthesia complications

## 2016-05-26 NOTE — Discharge Instructions (Signed)
°  Please go to your follow up appointment at the [facility] at [facility] across from the hospital. Your appointment is with Dr. Andria Frames on 06/01/16.  You will also need follow up appointments with oncology and gastroenterology to discuss biopsy results.

## 2016-05-26 NOTE — Progress Notes (Signed)
Family Medicine Teaching Service Daily Progress Note Intern Pager: [phone]  Patient name: Jordan Johnston Medical record number: [id number] Date of birth: [birth year] Age: 65 y.o. Gender: male  Primary Care Provider: No PCP Per Patient Consultants: GI Code Status: Full  Assessment and Plan: 65 y.o. male presenting with weakness with significant anemia. PMH is significant for coronary artery disease, HFrEF (EF 25-30%), history of right-sided carotid endarterectomy (2009), history of testicular cancer, hyperlipidemia. Former smoker.  # Anemia: Hemoglobin low at 6.5 at admission. Iron panel showing low iron of 31, ferritin low at 15. Fecal occult blood positive. Last colonoscopy in 2012. Echocardiogram 8/27 - compared to a prior study in 10/2015, the LVEF is slightly higher at 30-35%. -Hemoglobin improved to 9.8 following transfusion. Hgb stable this am at 9.1 -Colonoscopy and EGD performed.  Results below.  #Hx of testicular cancer. 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 Oncology consulted, rec obtain tissue biopsy by percutaneous IR. IR saw no appropriate window for biopsy of portocaval mass.  -CT chest performed to complete staging workup >> negative for metastatic dx, 2 subpleural nodes in left chest (unchanged to smaller compared to 2013 study and benign). -GI plans for repeat endoscopy today, will [facility].  - stable for discharge once he returns from procedure pending any changes during transport/procedure -[name] will follow - follow up with oncology as outpatient   # Acute Kidney Injury: Creatinine 1.02 today, improved from 1.54 at admission. Baseline 1.07. -Continue to monitor with BMPs -Caution with fluids given low EF.  # HFrEF: Currently prescribed Corlanor 5 mg twice daily, losartan 12.5 mg daily. Also in drug research trial for heart failure  medication (Vericiguat). Echocardiogram from February 2017 with EF 25-30%, mild LVH, systolic function severely reduced, diffuse hypokinesis. - held Losartan this am due to low BP, continue during rest of stay and at time of discharge -Continue Corlanor -Holding aspirin  # Prediabetes: A1c elevated to 6.1 in October 2016. -A1c 4.8  FEN/GI: Advance diet as tolerated. Prophylaxis: SCDs  Disposition: discharge once returned to [facility] from procedure at [facility]. Is set up with [facility] for hospital follow-up appointment on 9/7.  Subjective:  Feeling well today. Denies fatigue, chest pain, shortness of breath.   Objective: Temp:  [98.2 F (36.8 C)-98.4 F (36.9 C)] 98.4 F (36.9 C) (09/01 0525) Pulse Rate:  [71-79] 74 (09/01 0525) Resp:  [15-18] 18 (09/01 0525) BP: (91-111)/(48-56) 111/56 (09/01 0525) SpO2:  [84 %-97 %] 97 % (09/01 0525) Physical Exam: General: elderly male resting comfortably in no apparent distress Cardiovascular: RRR, systolic murmur present Respiratory: Clear to auscultation bilaterally, no increased work of breathing Abdomen: Soft and nondistended, nontender Extremities: No edema noted  Laboratory:  Recent Labs Lab 05/24/16 0404 05/25/16 0226 05/26/16 0612  WBC 7.4 8.0 6.6  HGB 9.3* 9.1* 9.1*  HCT 30.2* 29.7* 30.2*  PLT 275 292 281    Recent Labs Lab 05/19/16 1557  05/23/16 0618 05/24/16 0404 05/25/16 0226  NA 137  < > 133* 133* 132*  K 4.4  < > 4.3 4.0 4.1  CL 107  < > 99* 98* 99*  CO2 24  < > 23 25 25   BUN 23*  < > 16 24* 25*  CREATININE 1.54*  < > 0.98 1.21 1.14  CALCIUM 8.8*  < > 8.8* 8.5* 8.8*  PROT 7.0  --   --   --   --  BILITOT 0.6  --   --   --   --   ALKPHOS 78  --   --   --   --   ALT 12*  --   --   --   --   AST 18  --   --   --   --   GLUCOSE 107*  < > 97 106* 102*  < > = values in this interval not displayed.- Iron Panel: Iron 31, Ferritin 15, Vitamin B12 193 - FOBT Positive  Colonoscopy 8/28 showing non-thrombosed  external hemorrhoids, multiple 6 to 12 mm polyps in the sigmoid colon, in the transverse colon and in the ascending colon, removed with a hot snare. Resected and retrieved. Recommendations: clear liquid diet. Avoid aspirin, ibuprofen, naproxen or nsaids x5days after polyp removal. Pathology pending. Repeat colonoscopy at appointment to be scheduled for surveillance based on pathology results. CT abdomen pelvis with contrast for further evaluation, specifically to look at irregular ulceration at D3 and the large ulcer at duodenal bulb, and see if there is any underlying pancreatic pathology. [name] will follow. EGD 8/28 One deep non-bleeding cratered duodenal ulcer with pigmented material was found in the duodenal bulb. The lesion was 12 mm in largest dimension. Localized moderate mucosal changes characterized by friability (with contact bleeding), inflammation and ulceration were found in the third portion of the duodenum. Not biopsied; unclear if mass versus vascular anomaly, might even have been irregular-looking ampulla.   Steve Rattler, DO 05/26/2016, 7:30 AM PGY-1, [place] Intern pager: [phone], text pages welcome

## 2016-05-26 NOTE — Progress Notes (Signed)
Jordan Johnston is a 65 y.o. male patient admitted from [facility] awake, alert - oriented  X 4 - no acute distress noted.  VSS - Blood pressure (!) 114/49, pulse 70, temperature 97.4 F (36.3 C), temperature source Oral, resp. rate 18, height 5\' 9"  (1.753 m), weight 78.9 kg (174 lb), SpO2 99 %.    IV in place, occlusive dsg intact without redness.

## 2016-05-26 NOTE — Progress Notes (Signed)
Jordan Johnston to be D/C'd Home per MD order.  Discussed with the patient and all questions fully answered.  VSS, Skin clean, dry and intact without evidence of skin break down, no evidence of skin tears noted. IV catheter discontinued intact. Site without signs and symptoms of complications. Dressing and pressure applied.  An After Visit Summary was printed and given to the patient. Patient received prescription.  D/c education completed with patient/family including follow up instructions, medication list, d/c activities limitations if indicated, with other d/c instructions as indicated by MD - patient able to verbalize understanding, all questions fully answered.   Patient instructed to return to ED, call 911, or call MD for any changes in condition.   Patient escorted via [place], and D/C home via private auto.  L'ESPERANCE, Karlena Luebke C 05/26/2016 5:39 PM

## 2016-05-26 NOTE — Anesthesia Preprocedure Evaluation (Signed)
Anesthesia Evaluation  Patient identified by MRN, date of birth, ID band Patient awake    Reviewed: Allergy & Precautions, H&P , NPO status , Patient's Chart, lab work & pertinent test results, reviewed documented beta blocker date and time   Airway Mallampati: II  TM Distance: >3 FB Neck ROM: Full    Dental no notable dental hx.    Pulmonary former smoker,    Pulmonary exam normal breath sounds clear to auscultation       Cardiovascular hypertension, Pt. on medications + CAD, + Peripheral Vascular Disease and +CHF  + Valvular Problems/Murmurs AS  Rhythm:Regular Rate:Normal  10/2015: Previous TTE with EF 25-30% ? moderate or more severe AS   Dobutamine Study     Baseline: Peak velocity 3 m/sec, mean gradient 23 mmHg, peak 38   mmHg AVA by VTI .69 by Vmax .75   Peak Dobutamine: Peak velocity 3.61 m/sec, peak gradient 52 mmHg,   mean 29 mmHg AVA by VTI .82 by Vmax .79     This would indicate that the AS is not severe. Gradient increases   at peak dobutamine infusion but   valve area same or slightly larger. Overall consistant with   moderate to severe AS   Neuro/Psych negative neurological ROS     GI/Hepatic negative GI ROS, Neg liver ROS,   Endo/Other  negative endocrine ROS  Renal/GU negative Renal ROS     Musculoskeletal   Abdominal   Peds  Hematology  (+) anemia ,   Anesthesia Other Findings   Reproductive/Obstetrics                             Anesthesia Physical  Anesthesia Plan  ASA: II  Anesthesia Plan: MAC   Post-op Pain Management:    Induction: Intravenous  Airway Management Planned: Mask and Natural Airway  Additional Equipment:   Intra-op Plan:   Post-operative Plan:   Informed Consent: I have reviewed the patients History and Physical, chart, labs and discussed the procedure including the risks, benefits and alternatives for the proposed anesthesia with the  patient or authorized representative who has indicated his/her understanding and acceptance.   Dental Advisory Given  Plan Discussed with: CRNA and Surgeon  Anesthesia Plan Comments: (Discussed sedation and potential to need to place airway or ETT if warranted by clinical changes intra-operatively. We will start procedure as MAC.)        Anesthesia Quick Evaluation

## 2016-05-26 NOTE — Progress Notes (Signed)
Provided discharge information and answered all questions.

## 2016-05-26 NOTE — Progress Notes (Signed)
EUS done.  Full note to follow.  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  Biopsies done, preliminary cytology shows malignant cells, further studies required for differentiation.  OK for patient to advance diet, and can discharge home today; we can follow-up with final results next week as outpatient.

## 2016-05-26 NOTE — Progress Notes (Signed)
1045 transport patient to [facility]. [facility] stated that want patient there at 1100. [facility] contacted and transportation arranged.

## 2016-05-26 NOTE — Interval H&P Note (Signed)
History and Physical Interval Note:  05/26/2016 1:05 PM  Jordan Johnston  has presented today for surgery, with the diagnosis of Portocaval mass, duodenal ulcer  The various methods of treatment have been discussed with the patient and family. After consideration of risks, benefits and other options for treatment, the patient has consented to  Procedure(s): UPPER ENDOSCOPIC ULTRASOUND (EUS) LINEAR (Left) as a surgical intervention .  The patient's history has been reviewed, patient examined, no change in status, stable for surgery.  I have reviewed the patient's chart and labs.  Questions were answered to the patient's satisfaction.     Hildagarde Holleran M  Assessment:  1.  Portocaval lesion, with extension into duodenum.  Plan:  1.  Endoscopic ultrasound with possible fine needle aspiration or mucosal biopsies of portocaval lesion. 2.  Risks (bleeding, infection, bowel perforation that could require surgery, sedation-related changes in cardiopulmonary systems), benefits (identification and possible treatment of source of symptoms, exclusion of certain causes of symptoms), and alternatives (watchful waiting, radiographic imaging studies, empiric medical treatment) of upper endoscopy with ultrasound and possible biopsies (EUS +/- FNA) were explained to patient/family in detail and patient wishes to proceed.

## 2016-05-26 NOTE — Progress Notes (Signed)
MD made aware of BP 96/53 HR 75. Verbal order to hold losartan this morning. MD also made aware and asked to fill out EMTALA form for patient's transport today to [place] for a procedure at 1pm

## 2016-05-26 NOTE — Op Note (Signed)
[facility] Patient Name: Jordan Johnston Procedure Date: 05/26/2016 MRN: [id number] Attending MD: Arta Silence , MD Date of Birth: 1951-06-05 CSN: [id number] Age: 65 Admit Type: Outpatient Procedure:                Upper EUS Indications:              Duodenal mucosal mass/polyp found on endoscopy,                            Suspected mass on abdominal/pelvic CT scan Providers:                Arta Silence, MD, Laverta Baltimore RN, RN,                            Twilia Yaklin Dalton, Technician Referring MD:              Medicines:                Monitored Anesthesia Care, Cipro [id number] mg IV Complications:            No immediate complications. Estimated Blood Loss:     Estimated blood loss: none. Procedure:                Pre-Anesthesia Assessment:                           - Prior to the procedure, a History and Physical                            was performed, and patient medications and                            allergies were reviewed. The patient's tolerance of                            previous anesthesia was also reviewed. The risks                            and benefits of the procedure and the sedation                            options and risks were discussed with the patient.                            All questions were answered, and informed consent                            was obtained. Prior Anticoagulants: The patient has                            taken aspirin. ASA Grade Assessment: III - A                            patient with severe systemic disease. After  reviewing the risks and benefits, the patient was                            deemed in satisfactory condition to undergo the                            procedure.                           After obtaining informed consent, the endoscope was                            passed under direct vision. Throughout the                            procedure, the patient's blood  pressure, pulse, and                            oxygen saturations were monitored continuously. The                            [id number]) scope was introduced through                            the mouth, and advanced to the second part of                            duodenum. The [id number]) scope was                            introduced through the mouth, and advanced to the                            second part of duodenum. The upper EUS was                            accomplished without difficulty. The patient                            tolerated the procedure well. Scope In: Scope Out: Findings:      Endoscopic Finding :      A moderate extrinsic deformity was found in the duodenal bulb.      One non-obstructing non-bleeding cratered duodenal ulcer was found in       the duodenal bulb.      Endosonographic Finding :      There was no sign of significant endosonographic abnormality in the genu       of the pancreas, in the pancreatic body and in the pancreatic tail.      There was no sign of significant endosonographic abnormality in the left       lobe of the liver.      There was extrinsic compression in the second portion of the duodenum.       Endosonographic examination showed this to be due to a mass in       portocaval space invading  into the duodenum. Mass about 4cm x 6cm in       diameter, is heterogeneous with either cystic or necrotic sub-cm       anechoic spaces throughout, as well as patchy intermittent       calcifications. Fine needle aspiration for cytology was performed. Color       Doppler imaging was utilized prior to needle puncture to confirm a lack       of significant vascular structures within the needle path. Four passes       were made with the 25 gauge needle using a transduodenal approach. No       stylet was used. A cytologist was present and performed a preliminary       cytologic examination. The cellularity of the specimen was  adequate.       Final cytology results are pending. Impression:               - Duodenal deformity.                           - One non-obstructing non-bleeding duodenal ulcer.                           - There was no sign of significant pathology in the                            genu of the pancreas, in the pancreatic body and in                            the pancreatic tail.                           - There was no evidence of significant pathology in                            the left lobe of the liver.                           - Extrinsic compression was noted in the second                            portion of the duodenum due to a normal-appearing                            mass. Fine needle aspiration performed. Moderate Sedation:      None Recommendation:           - Return patient to referring hospital for ongoing                            care.                           - Soft diet today.                           - Continue present medications.                           -  Await cytology results.                           [facility] will sign-off; we can arrange outpatient                            follow-up with [place]; please call with any questions;                            thank you for the consultation.                           - OK for patient to be discharged home today from                            GI perspective, we will follow-up on pathology and                            direct further outpatient care regarding this                            portocaval mass. Procedure Code(s):        --- Professional ---                           [phone], Esophagogastroduodenoscopy, flexible,                            transoral; with transendoscopic ultrasound-guided                            intramural or transmural fine needle                            aspiration/biopsy(s) (includes endoscopic                            ultrasound examination of the esophagus, stomach,                             and either the duodenum or a surgically altered                            stomach where the jejunum is examined distal to the                            anastomosis) Diagnosis Code(s):        --- Professional ---                           K31.89, Other diseases of stomach and duodenum                           K26.9, Duodenal ulcer, unspecified as acute or  chronic, without hemorrhage or perforation                           K56.69, Other intestinal obstruction                           R93.5, Abnormal findings on diagnostic imaging of                            other abdominal regions, including retroperitoneum CPT copyright 2016 American Medical Association. All rights reserved. The codes documented in this report are preliminary and upon coder review may  be revised to meet current compliance requirements. Arta Silence, MD 05/26/2016 2:30:11 PM This report has been signed electronically. Number of Addenda: 0

## 2016-05-29 NOTE — Anesthesia Postprocedure Evaluation (Signed)
Anesthesia Post Note  Patient: Jordan Johnston  Procedure(s) Performed: Procedure(s) (LRB): UPPER ENDOSCOPIC ULTRASOUND (EUS) LINEAR (Left)  Patient location during evaluation: PACU Anesthesia Type: MAC Level of consciousness: awake and alert Pain management: pain level controlled Vital Signs Assessment: post-procedure vital signs reviewed and stable Respiratory status: spontaneous breathing, nonlabored ventilation, respiratory function stable and patient connected to nasal cannula oxygen Cardiovascular status: stable and blood pressure returned to baseline Anesthetic complications: no    Last Vitals:  Vitals:   05/26/16 1440 05/26/16 1525  BP: (!) 133/46 (!) 114/49  Pulse: 60 70  Resp: 17 18  Temp:  36.3 C    Last Pain:  Vitals:   05/26/16 1525  TempSrc: Oral  PainSc:                  Riccardo Dubin

## 2016-05-30 ENCOUNTER — Encounter (HOSPITAL_COMMUNITY): Payer: Self-pay | Admitting: Gastroenterology

## 2016-05-30 ENCOUNTER — Telehealth: Payer: Self-pay

## 2016-05-30 NOTE — Telephone Encounter (Signed)
Call to patient to follow up on hospital visit. Patient states "they did find a mass in my stomach and I am following up with PCP on Thursday and they will schedule me a Oncology appointment." Patient appreciative of care and states all is going well with Study medication and he has not missed any doses. Encouraged to call study coordinator if he has any questions or concerns about study.

## 2016-06-01 ENCOUNTER — Ambulatory Visit (INDEPENDENT_AMBULATORY_CARE_PROVIDER_SITE_OTHER): Payer: 59 | Admitting: Family Medicine

## 2016-06-01 ENCOUNTER — Encounter: Payer: Self-pay | Admitting: Family Medicine

## 2016-06-01 DIAGNOSIS — Z23 Encounter for immunization: Secondary | ICD-10-CM | POA: Diagnosis not present

## 2016-06-01 DIAGNOSIS — C801 Malignant (primary) neoplasm, unspecified: Secondary | ICD-10-CM

## 2016-06-01 DIAGNOSIS — D500 Iron deficiency anemia secondary to blood loss (chronic): Secondary | ICD-10-CM

## 2016-06-01 DIAGNOSIS — I5023 Acute on chronic systolic (congestive) heart failure: Secondary | ICD-10-CM

## 2016-06-01 LAB — CBC
HCT: 26.6 % — ABNORMAL LOW (ref 38.5–50.0)
Hemoglobin: 8.5 g/dL — ABNORMAL LOW (ref 13.2–17.1)
MCH: 27.4 pg (ref 27.0–33.0)
MCHC: 32 g/dL (ref 32.0–36.0)
MCV: 85.8 fL (ref 80.0–100.0)
MPV: 9.5 fL (ref 7.5–12.5)
PLATELETS: 360 10*3/uL (ref 140–400)
RBC: 3.1 MIL/uL — ABNORMAL LOW (ref 4.20–5.80)
RDW: 17.4 % — ABNORMAL HIGH (ref 11.0–15.0)
WBC: 8.6 10*3/uL (ref 3.8–10.8)

## 2016-06-01 MED ORDER — FERROUS SULFATE 325 (65 FE) MG PO TBEC: 325.0000 mg | DELAYED_RELEASE_TABLET | Freq: Three times a day (TID) | ORAL | 3 refills | Status: AC

## 2016-06-01 NOTE — Assessment & Plan Note (Signed)
Check hemoglobin and begin on iron.

## 2016-06-01 NOTE — Progress Notes (Signed)
   Subjective:    Patient ID: Jordan Johnston, male    DOB: July 04, 1951, 65 y.o.   MRN: [id number]  HPI FU hospitalization from chronic blood loss anemia.  Found to have an extrinsic mass pressing on duodenal bulb.  Biopsy reveils poorly differentiated carcinoma.   Has a remote history of testicular (34 years ago) doubt recurrrence.)  Was a smoker until Oct 2016.  Quit when had pneumonia.  Symptoms were easy fatigue.  Now more energy.  No GI bleeding noted.  He does have dark stools, not yet on iron.  Does not currently have GI or onc follow up visits scheduled.   Feels fine, wants to go back to work.  Note: he remains on aspirin due to known CAD. DC hgb was 9.1  Denies DOE or chest pain.    Review of Systems     Objective:   Physical Exam  Skin and conjunctiva pink. Lungs clear Cardiac RRR with 1/6 SEM Ext no edema.       Assessment & Plan:

## 2016-06-01 NOTE — Assessment & Plan Note (Signed)
Seems well controled 

## 2016-06-01 NOTE — Assessment & Plan Note (Signed)
Given biopsy results. Yes cancer. Oncology appointment.

## 2016-06-01 NOTE — Patient Instructions (Signed)
I am sorry to let you know that the biopsy did show cancer.  We are not sure where the cancer started. The most important follow up is with the oncologist, Dr. Alen Blew.  Someone should call you in the next couple of days with an appointment.  If you don't hear from anyone by next Tuesday, call us and ask about the status. It would be good to see Dr. Paulita Fujita at least once as an outpatient.  Someone should call with that appointment. I will call with the blood count result Start taking iron three times per day.

## 2016-06-06 ENCOUNTER — Ambulatory Visit (HOSPITAL_BASED_OUTPATIENT_CLINIC_OR_DEPARTMENT_OTHER): Payer: 59 | Admitting: Oncology

## 2016-06-06 ENCOUNTER — Telehealth: Payer: Self-pay | Admitting: Oncology

## 2016-06-06 VITALS — BP 91/43 | HR 81 | Temp 97.9°F | Resp 19 | Wt 174.3 lb

## 2016-06-06 DIAGNOSIS — D500 Iron deficiency anemia secondary to blood loss (chronic): Secondary | ICD-10-CM | POA: Insufficient documentation

## 2016-06-06 DIAGNOSIS — C801 Malignant (primary) neoplasm, unspecified: Secondary | ICD-10-CM

## 2016-06-06 NOTE — Progress Notes (Signed)
Hematology and Oncology Follow Up Visit  Jordan Johnston [id number] [birth year] 65 y.o. 06/06/2016 11:55 AM No PCP Per PatientHensel, Jordan Collin, MD   Principle Diagnosis: 65 year old gentleman with 6 cm mass in the abdomen diagnosed in August 2017. He presented with iron deficiency anemia related to mass effect and bleeding in the duodenum.  He has a remote history of testicular cancer over 30 years ago.   Prior Therapy: He is status post right orchiectomy and abdominal lymphadenectomy. The details of the surgery are not available to me as they were done close to 30 years ago. He received adjuvant systemic chemotherapy at that time. The exact chemotherapy he received is unclear to me.  He is status post EUS and biopsy done on 05/26/2016. The results showed malignancy of unclear primary or etiology.  Current therapy: Under evaluation for definitive therapy.  Interim History: Jordan Johnston presents today for a follow-up visit accompanied by his brother. He is a pleasant gentleman I saw in consultation during his hospitalization on 05/23/2016. At that time, he presented with iron deficiency anemia and found to have a 6 cm mass pushing on his duodenum. It appears to be calcified regional lymphadenopathy.  Tissue biopsy was attempted via EUS and fine needle aspiration obtained on 05/26/2016 showed poorly differentiated malignancy although the exact etiology or primary is unknown. I could not perform the biopsy because of the location.  Since his discharge, he feels reasonably well except for some mild dizziness. He had presented with a hemoglobin of 6.5 on 05/19/2016 and received packed red cell transfusion. His hemoglobin currently around 8.5 and currently taking oral iron. He denied any constipation, diarrhea, dyspepsia or abdominal pain. He does report some occasional dizziness. Is able to ambulate and eat without any major difficulties.  He does not report any headaches, blurry vision, syncope or  seizures. He does not report any fevers, chills or sweats. He does not report any cough, wheezing or hemoptysis. He does not report any nausea, vomiting, abdominal pain, early satiety or change in his bowel habits. He does not report any frequency urgency or hesitancy. He does not report any skeletal complaints. Remaining review of systems unremarkable.  Medications: I have reviewed the patient's current medications.  Current Outpatient Prescriptions  Medication Sig Dispense Refill  . AMBULATORY NON FORMULARY MEDICATION Take 10 mg by mouth daily. Medication Name: [place] study drug provided    . aspirin 81 MG tablet Take 81 mg by mouth daily.    Marland Kitchen atorvastatin (LIPITOR) 80 MG tablet Take 1 tablet (80 mg total) by mouth daily at 6 PM. 30 tablet 11  . ferrous sulfate 325 (65 FE) MG EC tablet Take 1 tablet (325 mg total) by mouth 3 (three) times daily with meals. 100 tablet 3  . ivabradine (CORLANOR) 5 MG TABS tablet Take 1 tablet (5 mg total) by mouth 2 (two) times daily with a meal. 60 tablet 6  . losartan (COZAAR) 25 MG tablet Take 0.5 tablets (12.5 mg total) by mouth daily. 30 tablet 11   No current facility-administered medications for this visit.      Allergies: No Known Allergies  Past Medical History, Surgical history, Social history, and Family History were reviewed and updated.   Physical Exam: Blood pressure (!) 91/43, pulse 81, temperature 97.9 F (36.6 C), temperature source Oral, resp. rate 19, weight 174 lb 5 oz (79.1 kg), SpO2 100 %. ECOG: 1 General appearance: alert and cooperative Head: Normocephalic, without obvious abnormality Neck: no  adenopathy Lymph nodes: Cervical, supraclavicular, and axillary nodes normal. Heart:regular rate and rhythm, S1, S2 normal, no murmur, click, rub or gallop Lung:chest clear, no wheezing, rales, normal symmetric air entry. Abdomin: soft, non-tender, without masses or organomegaly  GU examination: Right  testicle was not palpated. His left testicle appeared normal. No inguinal adenopathy or discharge noted. EXT:no erythema, induration, or nodules   Lab Results: Lab Results  Component Value Date   WBC 8.6 06/01/2016   HGB 8.5 (L) 06/01/2016   HCT 26.6 (L) 06/01/2016   MCV 85.8 06/01/2016   PLT 360 06/01/2016     Chemistry      Component Value Date/Time   NA 133 (L) 05/26/2016 0612   K 4.2 05/26/2016 0612   CL 102 05/26/2016 0612   CO2 23 05/26/2016 0612   BUN 22 (H) 05/26/2016 0612   CREATININE 1.03 05/26/2016 0612      Component Value Date/Time   CALCIUM 8.7 (L) 05/26/2016 0612   ALKPHOS 78 05/19/2016 1557   AST 18 05/19/2016 1557   ALT 12 (L) 05/19/2016 1557   BILITOT 0.6 05/19/2016 1557        Impression and Plan:   65 year old gentleman with the following issues:  1. 6.8 x 4.7 cm calcified mass noted on a CT scan with a mass effect on the duodenum with surgical clips noted in the area around a possible lymph node dissection that he had prior. He presented with iron deficiency anemia from a bleeding ulcer caused by this mass.  He has remote history of testicular cancer although this pattern of recurrence is very unusual for testicular cancer. The differential diagnosis includes GI malignancy, metastatic melanoma, lymphoma, metastatic testicular cancer among others.  He is status post fine-needle aspiration utilizing EUS on 05/26/2016 and the pathology showed malignancy but no further information. These findings discussed with Dr. Saralyn Pilar the reviewing pathologist and felt that there is no sufficient material to rule out any further studies.  These findings were discussed with the patient and his brother today. Based on the findings that we have now, it is safe to say that he has malignancy but is unclear to me what is the primary. Lymphoma is unlikely based on the presentation. His scans including chest abdomen and pelvis did not show any widespread metastasis. His  colonoscopy and endoscopy did not show primary GI malignancy.  Based on these findings, I have recommended surgical consultation for evaluation of a possible excisional biopsy to obtain sufficient material for further characterization of this malignancy. Consideration for curative resection will also be consideration given the localized nature of this malignancy. This might be technically challenging given his previous surgery and lymphadenectomy.  I will make the appropriate referral and he will follow-up in the next few weeks after that.  2. History of testicular cancer: He is status post right orchiectomy, lymphadenectomy and systemic chemotherapy. These details were provided by the patient and his brother and exact details are not available to me. I doubt this is testicular cancer recurrence but certainly a possibility.  3. Iron deficiency anemia: He is currently on iron replacement. I will recheck his iron studies in 3 weeks to insure adequate replacement. Intravenous iron can be utilized if he continues to be deficient.  4. Follow-up: In the next 3 weeks to assess his progress.  [facility], MD 9/12/201711:55 AM

## 2016-06-06 NOTE — Telephone Encounter (Signed)
Message sent to Infusion scheduler to add infusion on 06/28/16. Avs report and schedule given per 06/06/16 los.

## 2016-06-16 ENCOUNTER — Telehealth: Payer: Self-pay | Admitting: Oncology

## 2016-06-16 ENCOUNTER — Telehealth: Payer: Self-pay | Admitting: *Deleted

## 2016-06-16 NOTE — Telephone Encounter (Signed)
Per LOS and staff message ok to schedule treatment before the MD visit on 10/4. Scheduler notified

## 2016-06-16 NOTE — Telephone Encounter (Signed)
Spoke with patient re appointment with Dr. Marlou Starks @ [place] 06/21/2016 @ 3 pm to arrive 2:30 pm. Patient given appointment date/time/location/phone number.

## 2016-06-19 NOTE — Telephone Encounter (Signed)
No note

## 2016-06-21 ENCOUNTER — Telehealth: Payer: Self-pay | Admitting: Oncology

## 2016-06-21 NOTE — Telephone Encounter (Signed)
Called patient to  conf new appt time of 12:30pm on 06/28/16. L/M. 06/21/16

## 2016-06-28 ENCOUNTER — Ambulatory Visit (HOSPITAL_BASED_OUTPATIENT_CLINIC_OR_DEPARTMENT_OTHER): Payer: 59

## 2016-06-28 ENCOUNTER — Ambulatory Visit (HOSPITAL_BASED_OUTPATIENT_CLINIC_OR_DEPARTMENT_OTHER): Payer: 59 | Admitting: Oncology

## 2016-06-28 ENCOUNTER — Other Ambulatory Visit (HOSPITAL_BASED_OUTPATIENT_CLINIC_OR_DEPARTMENT_OTHER): Payer: 59

## 2016-06-28 ENCOUNTER — Telehealth: Payer: Self-pay | Admitting: Oncology

## 2016-06-28 VITALS — BP 117/43 | HR 64 | Temp 97.5°F | Resp 17 | Wt 176.2 lb

## 2016-06-28 VITALS — BP 117/43 | HR 64 | Temp 97.5°F | Resp 17

## 2016-06-28 DIAGNOSIS — D500 Iron deficiency anemia secondary to blood loss (chronic): Secondary | ICD-10-CM

## 2016-06-28 DIAGNOSIS — C801 Malignant (primary) neoplasm, unspecified: Secondary | ICD-10-CM

## 2016-06-28 DIAGNOSIS — R1900 Intra-abdominal and pelvic swelling, mass and lump, unspecified site: Secondary | ICD-10-CM

## 2016-06-28 LAB — IRON AND TIBC
%SAT: 14 % — AB (ref 20–55)
Iron: 45 ug/dL (ref 42–163)
TIBC: 321 ug/dL (ref 202–409)
UIBC: 277 ug/dL (ref 117–376)

## 2016-06-28 LAB — CBC WITH DIFFERENTIAL/PLATELET
BASO%: 0.3 % (ref 0.0–2.0)
BASOS ABS: 0 10*3/uL (ref 0.0–0.1)
EOS ABS: 0.2 10*3/uL (ref 0.0–0.5)
EOS%: 2 % (ref 0.0–7.0)
HEMATOCRIT: 27.4 % — AB (ref 38.4–49.9)
HGB: 8.5 g/dL — ABNORMAL LOW (ref 13.0–17.1)
LYMPH#: 2 10*3/uL (ref 0.9–3.3)
LYMPH%: 26.5 % (ref 14.0–49.0)
MCH: 27.1 pg — ABNORMAL LOW (ref 27.2–33.4)
MCHC: 31 g/dL — AB (ref 32.0–36.0)
MCV: 87.3 fL (ref 79.3–98.0)
MONO#: 0.9 10*3/uL (ref 0.1–0.9)
MONO%: 11.4 % (ref 0.0–14.0)
NEUT#: 4.5 10*3/uL (ref 1.5–6.5)
NEUT%: 59.8 % (ref 39.0–75.0)
Platelets: 283 10*3/uL (ref 140–400)
RBC: 3.14 10*6/uL — ABNORMAL LOW (ref 4.20–5.82)
RDW: 18 % — ABNORMAL HIGH (ref 11.0–14.6)
WBC: 7.5 10*3/uL (ref 4.0–10.3)

## 2016-06-28 LAB — COMPREHENSIVE METABOLIC PANEL WITH GFR
ALT: <9 U/L (ref 0–55)
AST: 11 U/L (ref 5–34)
Albumin: 3.1 g/dL — ABNORMAL LOW (ref 3.5–5.0)
Alkaline Phosphatase: 83 U/L (ref 40–150)
Anion Gap: 10 meq/L (ref 3–11)
BUN: 20.8 mg/dL (ref 7.0–26.0)
CO2: 21 meq/L — ABNORMAL LOW (ref 22–29)
Calcium: 9 mg/dL (ref 8.4–10.4)
Chloride: 105 meq/L (ref 98–109)
Creatinine: 1.4 mg/dL — ABNORMAL HIGH (ref 0.7–1.3)
EGFR: 52 ml/min/1.73 m2 — ABNORMAL LOW
Glucose: 102 mg/dL (ref 70–140)
Potassium: 3.9 meq/L (ref 3.5–5.1)
Sodium: 136 meq/L (ref 136–145)
Total Bilirubin: 0.36 mg/dL (ref 0.20–1.20)
Total Protein: 7.9 g/dL (ref 6.4–8.3)

## 2016-06-28 LAB — FERRITIN: FERRITIN: 35 ng/mL (ref 22–316)

## 2016-06-28 MED ORDER — FERUMOXYTOL INJECTION 510 MG/17 ML
510.0000 mg | Freq: Once | INTRAVENOUS | Status: AC
  Administered 2016-06-28: 510 mg via INTRAVENOUS
  Filled 2016-06-28: qty 17

## 2016-06-28 MED ORDER — SODIUM CHLORIDE 0.9 % IV SOLN
Freq: Once | INTRAVENOUS | Status: AC
  Administered 2016-06-28: 13:00:00 via INTRAVENOUS

## 2016-06-28 NOTE — Telephone Encounter (Signed)
Gave patient avs report and appointments for November.  °

## 2016-06-28 NOTE — Progress Notes (Signed)
Hematology and Oncology Follow Up Visit  KYION RAYOS [id number] 1951-01-15 65 y.o. 06/28/2016 2:54 PM No PCP Per PatientNo ref. provider found   Principle Diagnosis: 65 year old gentleman with 6 cm mass in the abdomen diagnosed in August 2017. He presented with iron deficiency anemia related to mass effect and bleeding in the duodenum.  He has a remote history of testicular cancer over 30 years ago.   Prior Therapy: He is status post right orchiectomy and abdominal lymphadenectomy. The details of the surgery are not available to me as they were done close to 30 years ago. He received adjuvant systemic chemotherapy at that time. The exact chemotherapy he received is unclear to me.  He is status post EUS and biopsy done on 05/26/2016. The results showed malignancy of unclear primary or etiology.  He is status post intravenous iron completed on 06/28/2016. He received 500 mg of Feraheme.  Current therapy:  Under evaluation for possible surgical resection.  Interim History: Mr. Garron presents today for a follow-up visit accompanied by his brother. Since his last visit, he was evaluated by Gen. surgery locally and felt that surgery cannot be done at this time. He was referred to [facility] for evaluation and potentially for a possible Whipple procedure. Clinically he feels reasonably well without any dizziness or lightheadedness. He received Feraheme without any major complications. He denied any constipation, diarrhea, dyspepsia or abdominal pain. He is able to ambulate and eat without any major difficulties.  He does not report any headaches, blurry vision, syncope or seizures. He does not report any fevers, chills or sweats. He does not report any cough, wheezing or hemoptysis. He does not report any nausea, vomiting, abdominal pain, early satiety or change in his bowel habits. He does not report any frequency urgency or hesitancy. He does not report any skeletal complaints. Remaining  review of systems unremarkable.  Medications: I have reviewed the patient's current medications.  Current Outpatient Prescriptions  Medication Sig Dispense Refill  . AMBULATORY NON FORMULARY MEDICATION Take 10 mg by mouth daily. Medication Name: [place] study drug provided    . aspirin 81 MG tablet Take 81 mg by mouth daily.    Marland Kitchen atorvastatin (LIPITOR) 80 MG tablet Take 1 tablet (80 mg total) by mouth daily at 6 PM. 30 tablet 11  . ferrous sulfate 325 (65 FE) MG EC tablet Take 1 tablet (325 mg total) by mouth 3 (three) times daily with meals. 100 tablet 3  . ivabradine (CORLANOR) 5 MG TABS tablet Take 1 tablet (5 mg total) by mouth 2 (two) times daily with a meal. 60 tablet 6  . losartan (COZAAR) 25 MG tablet Take 0.5 tablets (12.5 mg total) by mouth daily. 30 tablet 11   No current facility-administered medications for this visit.      Allergies: No Known Allergies  Past Medical History, Surgical history, Social history, and Family History were reviewed and updated.   Physical Exam: Blood pressure (!) 117/43, pulse 64, temperature 97.5 F (36.4 C), temperature source Oral, resp. rate 17, weight 176 lb 3.2 oz (79.9 kg), SpO2 100 %. ECOG: 1 General appearance: alert and cooperative no oral ulcers or lesions. Head: Normocephalic, without obvious abnormality no rebound or guarding. Neck: no adenopathy Lymph nodes: Cervical, supraclavicular, and axillary nodes normal. Heart:regular rate and rhythm, S1, S2 normal, no murmur, click, rub or gallop Lung:chest clear, no wheezing, rales, normal symmetric air entry. Abdomin: soft, non-tender, without masses or organomegaly  GU examination:  Right testicle was not palpated.No inguinal adenopathy or discharge noted. EXT:no erythema, induration, or nodules   Lab Results: Lab Results  Component Value Date   WBC 7.5 06/28/2016   HGB 8.5 (L) 06/28/2016   HCT 27.4 (L) 06/28/2016   MCV 87.3 06/28/2016   PLT  283 06/28/2016     Chemistry      Component Value Date/Time   NA 136 06/28/2016 1234   K 3.9 06/28/2016 1234   CL 102 05/26/2016 0612   CO2 21 (L) 06/28/2016 1234   BUN 20.8 06/28/2016 1234   CREATININE 1.4 (H) 06/28/2016 1234      Component Value Date/Time   CALCIUM 9.0 06/28/2016 1234   ALKPHOS 83 06/28/2016 1234   AST 11 06/28/2016 1234   ALT <9 06/28/2016 1234   BILITOT 0.36 06/28/2016 1234        Impression and Plan:   65 year old gentleman with the following issues:  1. 6.8 x 4.7 cm calcified mass noted on a CT scan with a mass effect on the duodenum with surgical clips noted in the area around a possible lymph node dissection that he had prior. He presented with iron deficiency anemia from a bleeding ulcer caused by this mass.  He has remote history of testicular cancer although this pattern of recurrence is very unusual for testicular cancer. The differential diagnosis includes GI malignancy, metastatic melanoma, lymphoma, metastatic testicular cancer among others.  He is status post fine-needle aspiration utilizing EUS on 05/26/2016 and the pathology showed malignancy but no further information. These findings discussed with Dr. Saralyn Pilar the reviewing pathologist and felt that there is no sufficient material to rule out any further studies.  He is under evaluation by Gen. surgery for surgical resection versus a repeat biopsy. He has a appointment at [facility] surgical oncology Department for an evaluation.  2. History of testicular cancer: He is status post right orchiectomy, lymphadenectomy and systemic chemotherapy. These details were provided by the patient and his brother and exact details are not available to me. I doubt this is testicular cancer recurrence but certainly a possibility.  3. Iron deficiency anemia: He is status post intravenous iron received today without any complications. His hemoglobin remains stable and his anemia could also be a result of  malignancy.  4. Follow-up: In the next 6 weeks to follow on his progress. Sooner if surgery cannot be done to determine different options of treatment.  [id number], MD 10/4/20172:54 PM

## 2016-06-28 NOTE — Patient Instructions (Signed)

## 2016-07-07 ENCOUNTER — Telehealth: Payer: Self-pay | Admitting: *Deleted

## 2016-07-07 NOTE — Telephone Encounter (Signed)
Brother notified that [facility] will call for next steps. Dr Alen Blew has not heard from [facility]

## 2016-07-07 NOTE — Telephone Encounter (Signed)
Patient's brother, Edd Arbour called to see if Dr Alen Blew has spoken with MD @ [facility]. They went for referral and was told [facility] would be in touch with Dr Alen Blew on 07/03/16 to discuss treatment plan. Mr Allie is wanting to know how he is supposed to proceed. MD at [facility] can perform surgery.

## 2016-07-07 NOTE — Telephone Encounter (Signed)
I have not been contacted by anyone. They will contact him directly regards the next steps.

## 2016-07-07 NOTE — Telephone Encounter (Signed)
Spoke with brother ronnie, per dr Alen Blew, we have not heard anything from [facility]. I gave ronnie my fax # so they could send office notes.

## 2016-07-25 ENCOUNTER — Other Ambulatory Visit: Payer: Self-pay

## 2016-07-25 MED ORDER — ATORVASTATIN CALCIUM 80 MG PO TABS: 80.0000 mg | ORAL_TABLET | Freq: Every day | ORAL | 11 refills | Status: AC

## 2016-08-10 ENCOUNTER — Ambulatory Visit: Payer: 59 | Admitting: Oncology

## 2016-08-10 ENCOUNTER — Other Ambulatory Visit: Payer: 59

## 2016-08-28 ENCOUNTER — Telehealth: Payer: Self-pay | Admitting: Internal Medicine

## 2016-08-28 NOTE — Telephone Encounter (Signed)
[facility] pharmacy calling requesting a refill on Losartan 25 mg tablet. Please advise

## 2016-08-29 ENCOUNTER — Other Ambulatory Visit (HOSPITAL_COMMUNITY): Payer: Self-pay | Admitting: Adult Health

## 2016-08-30 ENCOUNTER — Telehealth (HOSPITAL_COMMUNITY): Payer: Self-pay

## 2016-08-30 NOTE — Telephone Encounter (Signed)
Patient called CHF clinic triage line to report wipple procedure at [facility] was cancelled due to EF of 25%.  However, our most recent echo showed EF 30-35%. Advised patient to have office call us or fax request for medical records if necessary.  Renee Pain, RN

## 2016-08-31 ENCOUNTER — Other Ambulatory Visit: Payer: Self-pay | Admitting: *Deleted

## 2016-08-31 MED ORDER — LOSARTAN POTASSIUM 25 MG PO TABS: 12.5000 mg | ORAL_TABLET | Freq: Every day | ORAL | 6 refills | Status: AC

## 2016-09-06 ENCOUNTER — Telehealth (HOSPITAL_COMMUNITY): Payer: Self-pay | Admitting: *Deleted

## 2016-09-06 NOTE — Telephone Encounter (Signed)
Received note from [facility], pt needs pancreaticoduodenectomy with intra-op radiation treatment and needs clearance.  Per Dr Tempie Hoist:  "Patient with EF 30-35% with moderate aortic stenosis is at HIGH but NOT prohibitive risk ofr peri-op CV complications, can proceed."  Note faxed to [facility], nurse navigator for GI surgical-oncology at [facility] at [phone]

## 2016-09-12 DIAGNOSIS — Z006 Encounter for examination for normal comparison and control in clinical research program: Secondary | ICD-10-CM

## 2016-09-12 NOTE — Progress Notes (Signed)
Patient present for [place] visit 6. He states he is doing well, and is awaiting surgery at [facility] for the mass in his abdomen. He states Dr. Cloyd Stagers, the [facility] surgeon instructed him on Dec 1 to hold his study medication until after surgery. The was suppose to have surgery on Dec 6th but it was cancelled until clearance from Heart Failure doctor. Clearance was given and surgery is now scheduled for Jan 17th and he was instructed to remain off the study medication. Dr. Aundra Dubin is aware and agrees with patient remaining off IP until after surgery. Patient states he is unsure if he will need chemo or radiation. BP today 96/48, asymptomatic. States he has been working and overall feels good. Patient denies exacerbation signs or symptoms of HF and will call the HF clinic if any HF symptoms present. Patient will call study coordinator after his surgery to schedule research visit. No IP dispensed today.

## 2016-10-26 DEATH — deceased

## 2016-11-01 ENCOUNTER — Encounter (HOSPITAL_COMMUNITY): Payer: 59 | Admitting: Internal Medicine

## 2016-11-07 ENCOUNTER — Encounter: Payer: Self-pay | Admitting: *Deleted

## 2016-11-20 ENCOUNTER — Telehealth: Payer: Self-pay

## 2016-11-20 NOTE — Telephone Encounter (Signed)
Spoke to patient's brother, Marrion Leser, he states patient passed away on [id number] due to complications of whipple procedure done at [facility]; he states procedure was about 10 days earlier. Condolences given.

## 2016-11-22 ENCOUNTER — Telehealth: Payer: Self-pay

## 2016-11-22 DIAGNOSIS — Z006 Encounter for examination for normal comparison and control in clinical research program: Secondary | ICD-10-CM

## 2016-11-22 NOTE — Progress Notes (Signed)
Telephone note entered below date of 11-22-16 was late entry from 10-17-16. Writer spoke to patient 10-17-16 as he was 6 days post-op at [facility]. He states tumor was removed and he is currently recovering at [facility].  Writer was unable to contact patient for several weeks. Spoke to patient's brother  11-20-16 and he states patient passed away 11/10/16.

## 2016-11-22 NOTE — Telephone Encounter (Signed)
Called patient to inquire on his status as he had surgery on 10-11-16. He states "it was rough, but I'm hanging in there. I will take it one day at a time." Reassured and writer will follow up with him in a week or so.

## 2017-01-08 ENCOUNTER — Other Ambulatory Visit: Payer: Self-pay | Admitting: Nurse Practitioner

## 2017-10-02 IMAGING — DX DG CHEST 2V
2 series · 2 of 2 positions shown · non-contrast
Comparison: Chest x-ray and chest CT scan May 05, 2012.

CLINICAL DATA: Shortness of breath, history of presyncope, cardiac
murmur, current smoker.

EXAM:
CHEST  2 VIEW

[chest pa]
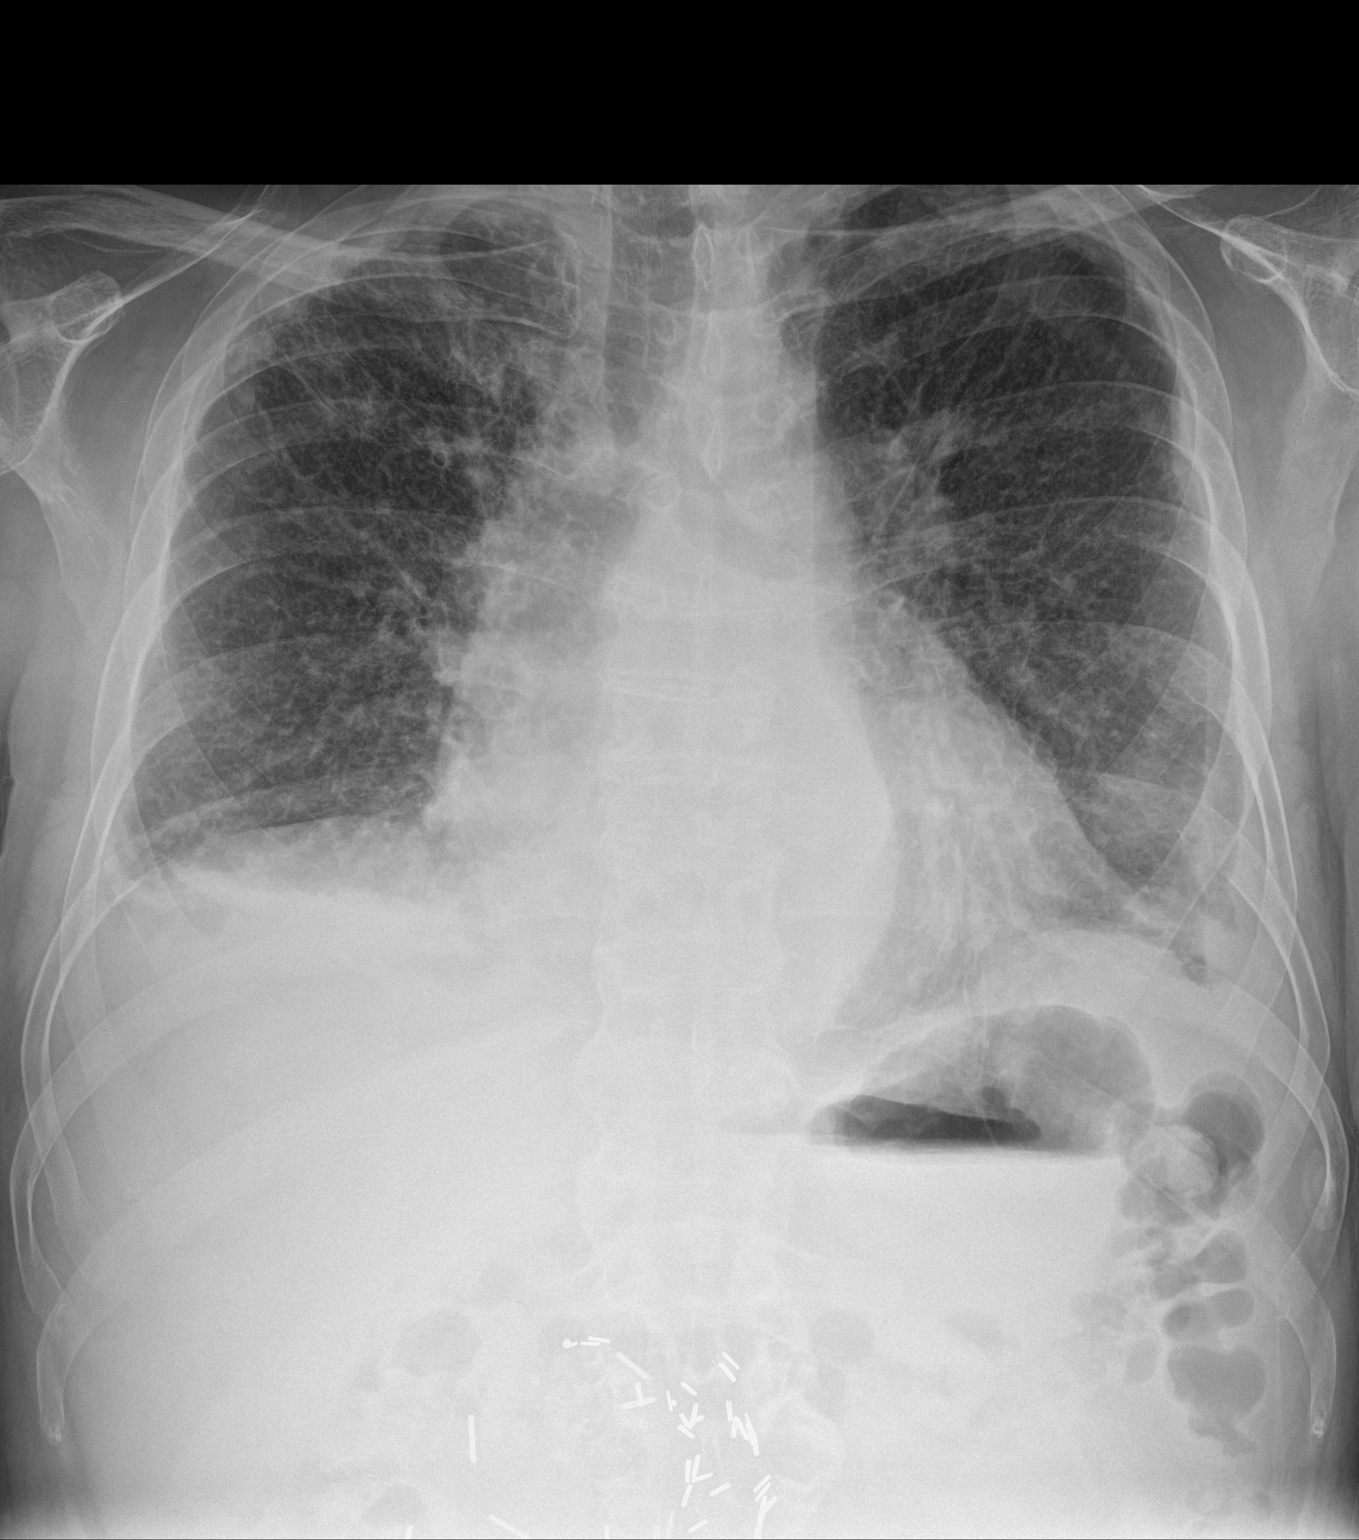

[chest lat]
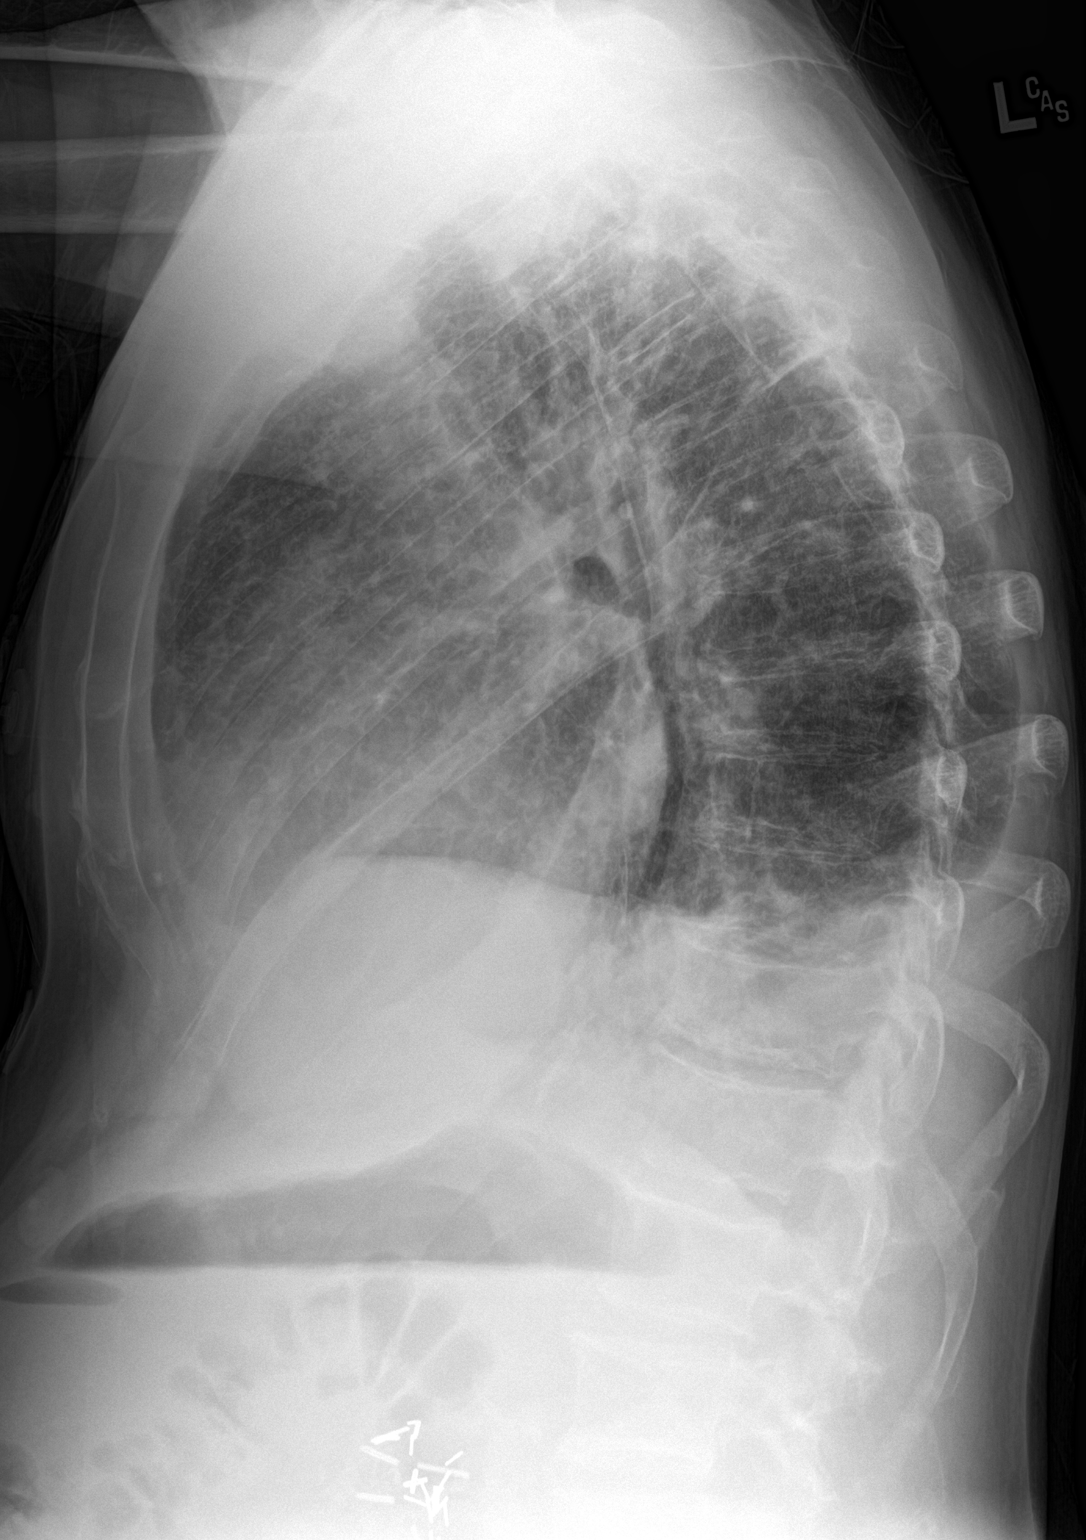

[2 of 2 positions shown; findings below may reference images not displayed]

FINDINGS: The lungs are adequately inflated. The interstitial markings are
increased bilaterally. There small bilateral pleural effusions.
There is apical pleural thickening which is stable. The cardiac
silhouette is enlarged. The pulmonary vascularity is mildly engorged
and indistinct. There is tortuosity of the descending thoracic
aorta. The mediastinum is normal in width. The bony thorax exhibits
no acute abnormalities.
IMPRESSION: CHF with moderate pulmonary interstitial edema and small bilateral
pleural effusions. Follow-up radiographs following anticipated
antibiotic therapy are recommended.

## 2018-08-01 IMAGING — CT CT CHEST W/ CM
2 of 3 series · 13 of 36 positions shown, 16 images · IV contrast (Iodine)
Comparison: CT chest 07/29/2015 and 05/05/2012. CT abdomen and
pelvis 05/23/2016.

CLINICAL DATA: Patient with history of testicular carcinoma with
possible recurrent disease in the abdomen and pelvis.

EXAM:
CT CHEST WITH CONTRAST
TECHNIQUE: Multidetector CT imaging of the chest was performed during
intravenous contrast administration.
CONTRAST:  75 ml [id number] IOPAMIDOL ([id number]) INJECTION 61%

[Series 201: chest with, idose (2) · axial · 0.68mm/px · z∈[-386,-138]mm · 10 of 117 slices shown, 13 images]
[im 9/117  mediastinal]
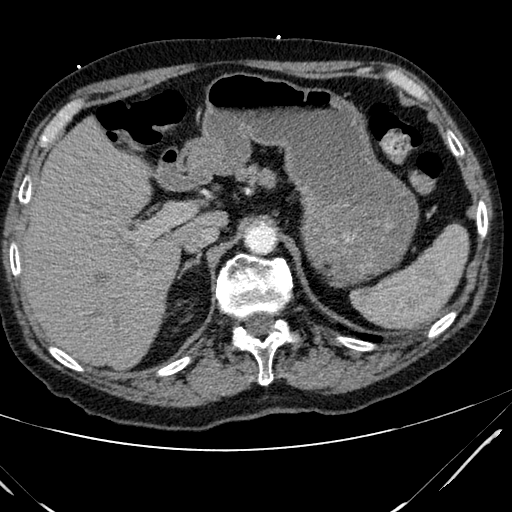
[im 9/117  lung]
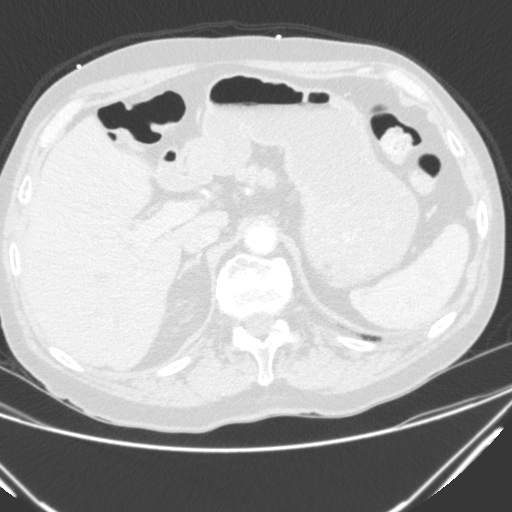
[im 18/117  lung]
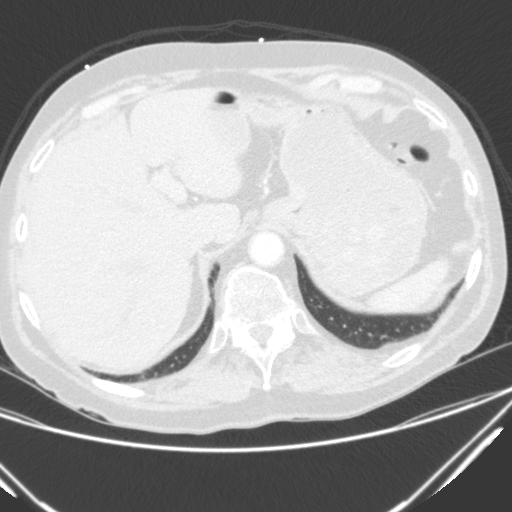
[im 31/117  lung]
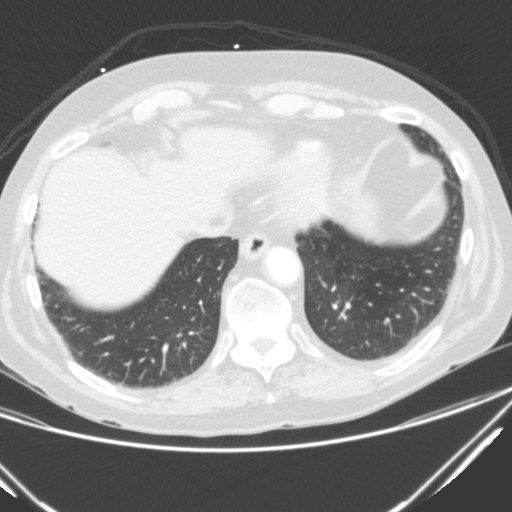
[im 43/117  lung]
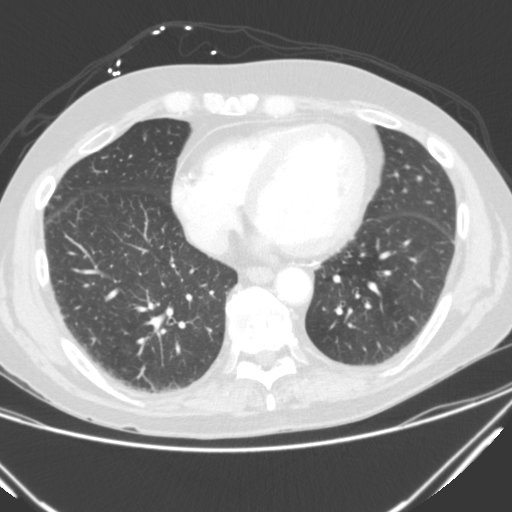
[im 52/117  mediastinal]
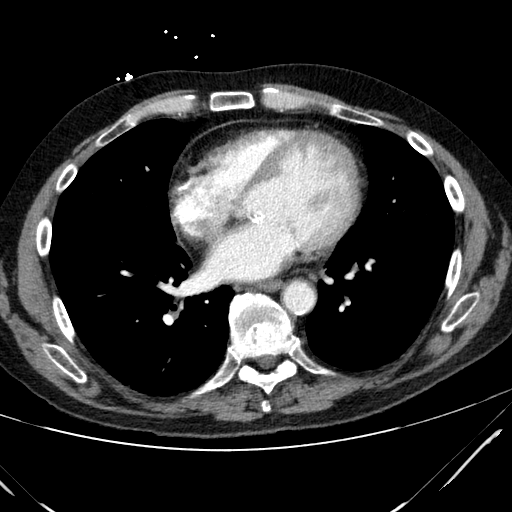
[im 52/117  lung]
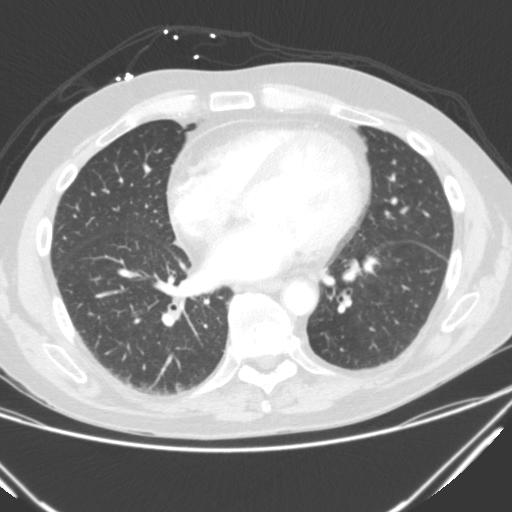
[im 65/117  lung]
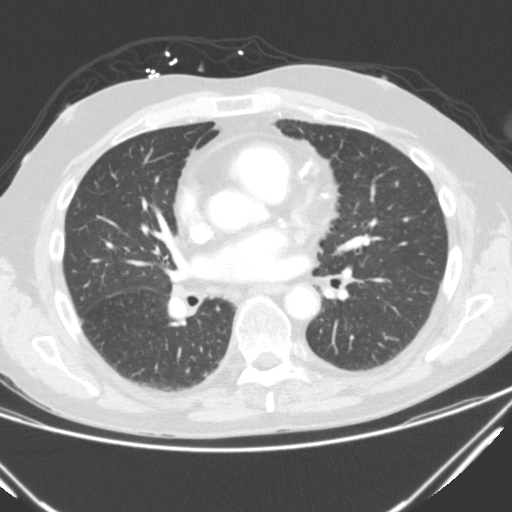
[im 74/117  lung]
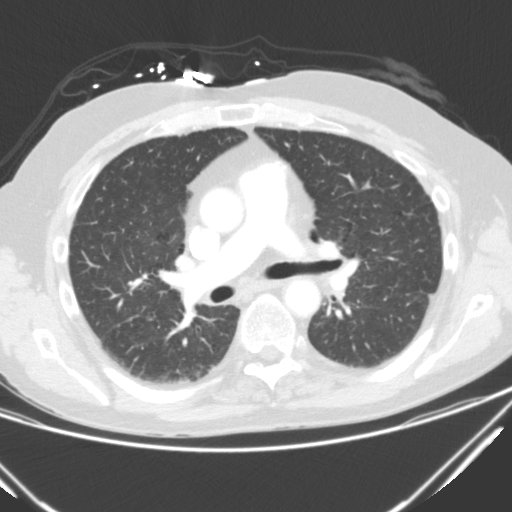
[im 86/117  lung]
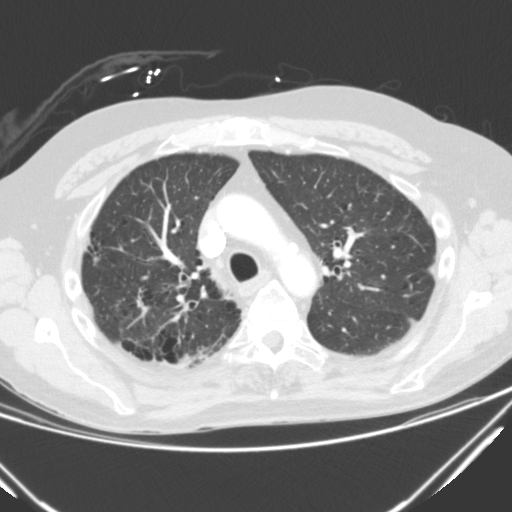
[im 99/117  mediastinal]
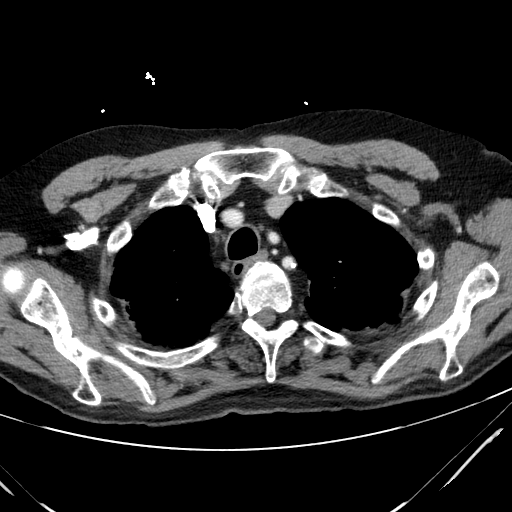
[im 99/117  lung]
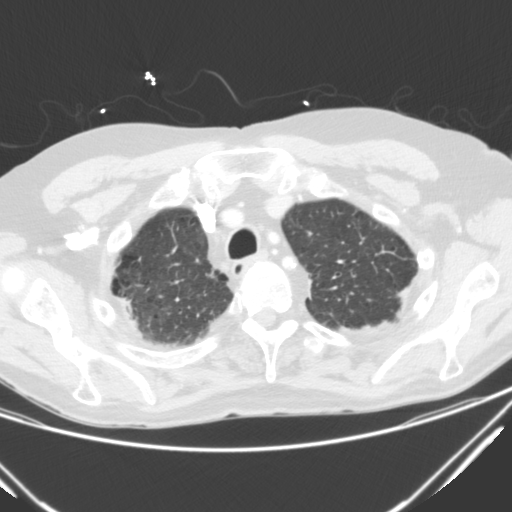
[im 108/117  lung]
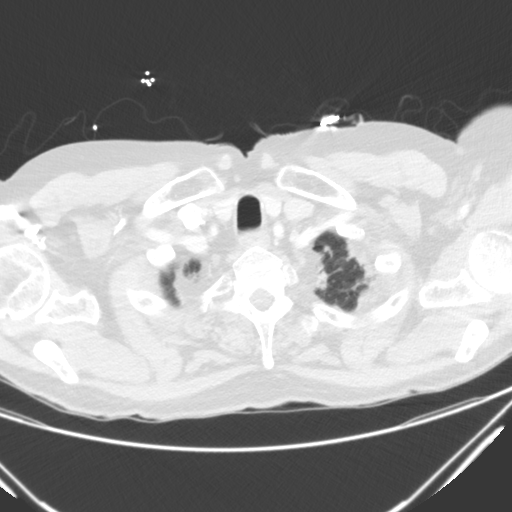

[Series 203: coronal, idose (2) · coronal · 0.45mm/px · 3 of 106 slices shown]
[im 22/106  lung]
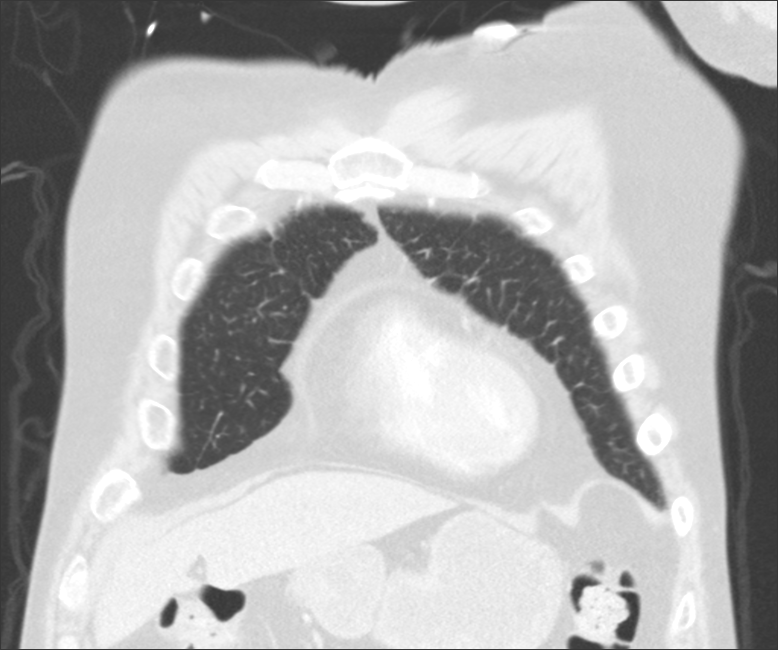
[im 43/106  lung]
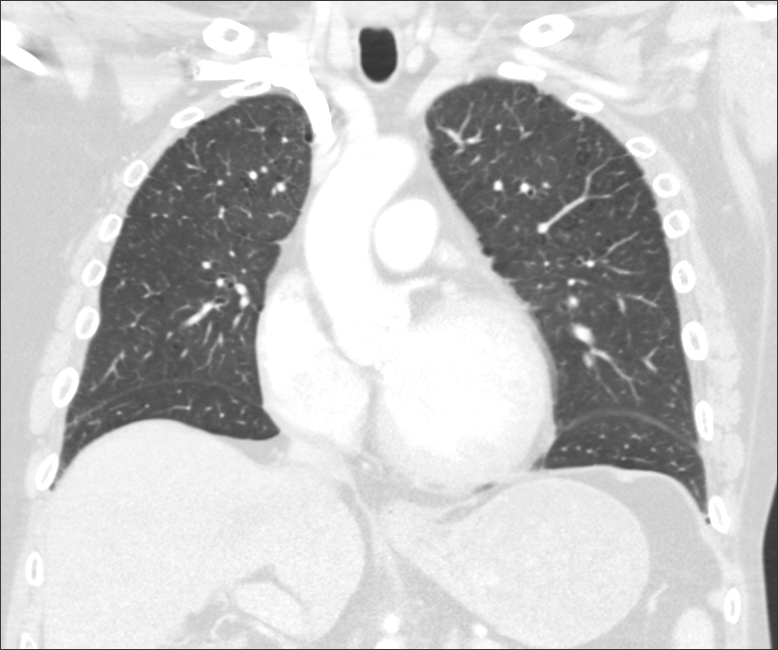
[im 64/106  lung]
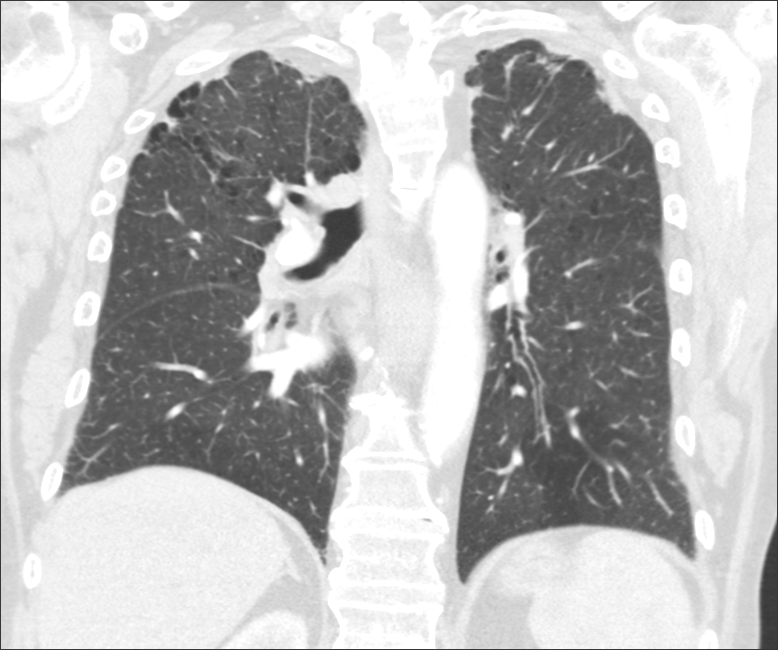

[13 of 36 positions shown; findings below may reference images not displayed]

FINDINGS: Cardiovascular: Extensive calcific coronary artery disease is
identified. Scattered aortic atherosclerosis is also seen. Heart
size is upper normal. No pericardial effusion. Pericardial
calcification posteriorly about the left ventricle is unchanged.

Mediastinum/Nodes: 1.4 x 1.4 cm right hilar lymph node on image 39
is unchanged since 7797. No new or enlarging lymph nodes are
identified in the axilla, hila or mediastinum.

Lungs/Pleura: Scattered pleural-parenchymal scarring and
centrilobular and paraseptal emphysematous disease are identified as
on the prior exams. A 0.3 cm subpleural nodule in the lingula on
image 172 and a 0.4 cm subpleural nodule in the left lower lobe on
image 82 are stable in size to slightly smaller. No new or enlarging
pulmonary nodule is identified. There is some peribronchial
thickening.

Upper Abdomen: For findings regarding the upper abdomen, please
refer to report of dedicated CT abdomen and pelvis yesterday.

Musculoskeletal: No lytic or sclerotic lesion is identified. Mild
appearing thoracic spondylosis is noted.
IMPRESSION: Negative for metastatic disease.

Extensive calcific coronary atherosclerosis. Scattered aortic
atherosclerosis also noted.

Paraseptal and centrilobular emphysema and chronic bronchitic change
consistent with COPD.

Two subpleural nodules in the left chest are unchanged to smaller
compared to the 7797 study and consistent with benignity.
